# Patient Record
Sex: Male | Born: 1960 | Race: Black or African American | Hispanic: No | Marital: Married | Smoking: Never smoker
Health system: Southern US, Community
[De-identification: ages and names within clinical notes are randomized; demographics above are authoritative.]

## PROBLEM LIST (undated history)

## (undated) DIAGNOSIS — E785 Hyperlipidemia, unspecified: Secondary | ICD-10-CM

## (undated) DIAGNOSIS — I10 Essential (primary) hypertension: Secondary | ICD-10-CM

## (undated) HISTORY — DX: Essential (primary) hypertension: I10

## (undated) HISTORY — DX: Hyperlipidemia, unspecified: E78.5

---

## 2013-03-06 DIAGNOSIS — I10 Essential (primary) hypertension: Secondary | ICD-10-CM | POA: Insufficient documentation

## 2013-03-06 DIAGNOSIS — E785 Hyperlipidemia, unspecified: Secondary | ICD-10-CM | POA: Insufficient documentation

## 2016-06-26 ENCOUNTER — Ambulatory Visit (INDEPENDENT_AMBULATORY_CARE_PROVIDER_SITE_OTHER): Payer: BLUE CROSS/BLUE SHIELD | Admitting: Family Medicine

## 2016-06-26 ENCOUNTER — Encounter: Payer: Self-pay | Admitting: Family Medicine

## 2016-06-26 VITALS — BP 154/104 | HR 80 | Ht 73.6 in | Wt 330.0 lb

## 2016-06-26 DIAGNOSIS — I10 Essential (primary) hypertension: Secondary | ICD-10-CM

## 2016-06-26 DIAGNOSIS — E785 Hyperlipidemia, unspecified: Secondary | ICD-10-CM

## 2016-06-26 DIAGNOSIS — Z23 Encounter for immunization: Secondary | ICD-10-CM | POA: Diagnosis not present

## 2016-06-26 DIAGNOSIS — M25569 Pain in unspecified knee: Secondary | ICD-10-CM | POA: Insufficient documentation

## 2016-06-26 DIAGNOSIS — Z1159 Encounter for screening for other viral diseases: Secondary | ICD-10-CM

## 2016-06-26 DIAGNOSIS — Z114 Encounter for screening for human immunodeficiency virus [HIV]: Secondary | ICD-10-CM | POA: Diagnosis not present

## 2016-06-26 DIAGNOSIS — M25561 Pain in right knee: Secondary | ICD-10-CM

## 2016-06-26 DIAGNOSIS — Z9189 Other specified personal risk factors, not elsewhere classified: Secondary | ICD-10-CM

## 2016-06-26 DIAGNOSIS — R0683 Snoring: Secondary | ICD-10-CM

## 2016-06-26 LAB — COMPREHENSIVE METABOLIC PANEL
ALBUMIN: 4.1 g/dL (ref 3.6–5.1)
ALT: 18 U/L (ref 9–46)
AST: 18 U/L (ref 10–35)
Alkaline Phosphatase: 80 U/L (ref 40–115)
BUN: 14 mg/dL (ref 7–25)
CHLORIDE: 105 mmol/L (ref 98–110)
CO2: 26 mmol/L (ref 20–31)
CREATININE: 0.92 mg/dL (ref 0.70–1.33)
Calcium: 9.5 mg/dL (ref 8.6–10.3)
Glucose, Bld: 99 mg/dL (ref 65–99)
POTASSIUM: 4.3 mmol/L (ref 3.5–5.3)
SODIUM: 141 mmol/L (ref 135–146)
Total Bilirubin: 0.5 mg/dL (ref 0.2–1.2)
Total Protein: 6.3 g/dL (ref 6.1–8.1)

## 2016-06-26 LAB — LIPID PANEL
CHOL/HDL RATIO: 3.2 ratio (ref <=5.0)
CHOLESTEROL: 201 mg/dL — AB (ref 125–200)
HDL: 63 mg/dL (ref >=40)
LDL Cholesterol: 118 mg/dL (ref <130)
Triglycerides: 102 mg/dL (ref <150)
VLDL: 20 mg/dL (ref <30)

## 2016-06-26 LAB — CBC
HCT: 49.8 % (ref 38.5–50.0)
Hemoglobin: 16.6 g/dL (ref 13.2–17.1)
MCH: 30.3 pg (ref 27.0–33.0)
MCHC: 33.3 g/dL (ref 32.0–36.0)
MCV: 90.9 fL (ref 80.0–100.0)
MPV: 10.2 fL (ref 7.5–12.5)
PLATELETS: 187 10*3/uL (ref 140–400)
RBC: 5.48 MIL/uL (ref 4.20–5.80)
RDW: 14.4 % (ref 11.0–15.0)
WBC: 4.7 10*3/uL (ref 3.8–10.8)

## 2016-06-26 LAB — HEMOGLOBIN A1C
Hgb A1c MFr Bld: 5.4 % (ref <5.7)
MEAN PLASMA GLUCOSE: 108 mg/dL

## 2016-06-26 LAB — TSH: TSH: 0.52 mIU/L (ref 0.40–4.50)

## 2016-06-26 LAB — HEPATITIS C ANTIBODY: HCV Ab: NEGATIVE

## 2016-06-26 MED ORDER — OLMESARTAN MEDOXOMIL-HCTZ 40-12.5 MG PO TABS: 1.0000 | ORAL_TABLET | Freq: Every day | ORAL | 1 refills | Status: DC

## 2016-06-26 MED ORDER — ATORVASTATIN CALCIUM 40 MG PO TABS: 40.0000 mg | ORAL_TABLET | Freq: Every day | ORAL | 0 refills | Status: DC

## 2016-06-26 NOTE — Progress Notes (Signed)
Albert Mills is a 55 y.o. male who presents to [name]: [facility] today to establish care.  He has a history of hypertension and hyperlipidemia.  His only current complaint at this time is a shooting pain down his right leg from his anterior thigh to medial knee.  This pain has been going on for 2 years and he reports it is well controlled with visits to the chiropractor and tumeric.  The pain occurs only at night and is associated with numbness.  Denies alleviating and aggravating factors.  Denies fevers, chills, weight loss, and weakness.    Hypertension: claims his pressure usually runs in the 140s systolic.  Was on Benicar for years until taking himself off of it 3 years ago.  Reports a history of ACE inhibitor induced cough.  Denies chest pain and palpitations.  Hyperlipidemia: Stopped taking Lipitor 3 or 4 years ago.  Takes Omega 3 fish oil every morning.  Denies problems taking Lipitor.    Health maintenance:  Due for colon cancer screening, hepatitis C screening, and a tetanus shot.  Refused HIV screening today.  OSA screen resulted as high risk.    Past Medical History:  Diagnosis Date  . HTN (hypertension)   . Hyperlipemia    History reviewed. No pertinent surgical history. Social History  Substance Use Topics  . Smoking status: Never Smoker  . Smokeless tobacco: Never Used  . Alcohol use Yes   family history includes Heart disease in his paternal grandfather and paternal uncle; Lupus in his mother and sister.  ROS as above: No headache, visual changes, nausea, vomiting, diarrhea, constipation, dizziness, abdominal pain, skin rash, fevers, chills, night sweats, weight loss, swollen lymph nodes, body aches, joint swelling, chest pain, shortness of breath, mood changes, visual or auditory hallucinations.   Medications: Current Outpatient Prescriptions  Medication Sig  Dispense Refill  . atorvastatin (LIPITOR) 40 MG tablet Take 1 tablet (40 mg total) by mouth daily. 90 tablet 0  . olmesartan-hydrochlorothiazide (BENICAR HCT) 40-12.5 MG tablet Take 1 tablet by mouth daily. 30 tablet 1   No current facility-administered medications for this visit.    Allergies  Allergen Reactions  . Lisinopril Other (See Comments)    Cough     Exam:  BP (!) 154/104   Pulse 80   Ht 6' 1.6" (1.869 m)   Wt (!) 330 lb (149.7 kg)   BMI 42.83 kg/m  Gen: Well NAD HEENT: EOMI,  MMM Lungs: Normal work of breathing. CTABL, large neck Heart: RRR no MRG Abd: NABS, Soft. Nondistended, Nontender.  Obese Exts: Brisk capillary refill, warm and well perfused.  MSK: Negative straight leg raise bilaterally.  5/5 strength in bilateral lower extremities.  Sensation intact to light touch.  Normal right hip range of motion without pain.  No results found for this or any previous visit (from the past 24 hour(s)). No results found.    Assessment and Plan: 55 y.o. male with right leg pain and numbness concerning for lumbar radiculopathy.  Currently well controlled with chiropractor visits and tumeric.    Hypertension:  154/104 in the clinic.  Will restart his Benicar HCT 40-12.5 mg daily given his history of ACE induced cough.    Hyperlipidemia: Recheck lipid panel.  Start Lipitor 40 mg given CAD risk factors of obesity, HLD, age, and family history.    Health maintenance:  Patient will decide which type of colon cancer screening he prefers.  Will follow  up with him regarding this at his next visit in a month.  Given tetanus shot.  Deferred HIV screening but will check Hepatitis C.  Outpatient sleep study ordered after he had a high risk OSA screen.    We also will check CBC, CMP, Vitamin D, A1c, and TSH.  Review results at follow up in 1 month.    Discussed warning signs or symptoms. Please see discharge instructions. Patient expresses understanding.

## 2016-06-26 NOTE — Addendum Note (Signed)
Addended by: Rodolph Bong on: 06/26/2016 10:56 AM   Modules accepted: Orders

## 2016-06-26 NOTE — Patient Instructions (Addendum)
Thank you for coming in today. Return in 1 month.  Get fasting labs.  You should hear from [place] sleep medicine soon.  Return sooner if needed.   Start Benicar and Lipitor.   Hydrochlorothiazide, HCTZ; Olmesartan Tablets What is this medicine? HYDROCHLOROTHIAZIDE; OLMESARTAN (hye droe klor oh THYE a zide; all mi SAR tan) is a combination of a diuretic and a drug that relaxes blood vessels. It is used to treat high blood pressure. This medicine may be used for other purposes; ask your health care provider or pharmacist if you have questions. What should I tell my health care provider before I take this medicine? They need to know if you have any of these conditions: -asthma -decreased urine -if you are on a special diet, such as a low salt diet -immune system problems, like lupus -kidney disease -liver disease -an unusual reaction to hydrochlorothiazide, olmesartan, sulfa drugs, other medicines, foods, dyes, or preservatives -pregnant or trying to get pregnant -breast-feeding How should I use this medicine? Take this medicine by mouth with a glass of water. Follow the directions on the prescription label. You can take it with or without food. If it upsets your stomach, take it with food. Take your medicine at regular intervals. Do not take it more often than directed. Do not stop taking except on your doctor's advice. Talk to your pediatrician regarding the use of this medicine in children. Special care may be needed. Overdosage: If you think you have taken too much of this medicine contact a poison control center or emergency room at once. NOTE: This medicine is only for you. Do not share this medicine with others. What if I miss a dose? If you miss a dose, take it as soon as you can. If it is almost time for your next dose, take only that dose. Do not take double or extra doses. What may interact with this medicine? -barbiturates like phenobarbital -corticosteroids like  prednisone -diuretics, especially triamterene, spironolactone or amiloride -lithium -medicines for diabetes -medicines for high blood pressure -NSAIDs like ibuprofen -potassium salts or potassium supplements -prescription pain medicines -skeletal muscle relaxants like tubocurarine -some cholesterol lowering medications like cholestyramine or colestipol This list may not describe all possible interactions. Give your health care provider a list of all the medicines, herbs, non-prescription drugs, or dietary supplements you use. Also tell them if you smoke, drink alcohol, or use illegal drugs. Some items may interact with your medicine. What should I watch for while using this medicine? Check your blood pressure regularly while you are taking this medicine. Ask your doctor or health care professional what your blood pressure should be and when you should contact him or her. When you check your blood pressure, write down the measurements to show your doctor or health care professional. If you are taking this medicine for a long time, you must visit your health care professional for regular checks on your progress. Make sure you schedule appointments on a regular basis. You must not get dehydrated. Ask your doctor or health care professional how much fluid you need to drink a day. Check with him or her if you get an attack of severe diarrhea, nausea and vomiting, or if you sweat a lot. The loss of too much body fluid can make it dangerous for you to take this medicine. Women should inform their doctor if they wish to become pregnant or think they might be pregnant. There is a potential for serious side effects to an unborn child, particularly  in the second or third trimester. Talk to your health care professional or pharmacist for more information. You may get drowsy or dizzy. Do not drive, use machinery, or do anything that needs mental alertness until you know how this drug affects you. Do not stand or  sit up quickly, especially if you are an older patient. This reduces the risk of dizzy or fainting spells. Alcohol can make you more drowsy and dizzy. Avoid alcoholic drinks. This medicine may affect your blood sugar level. If you have diabetes, check with your doctor or health care professional before changing the dose of your diabetic medicine. Avoid salt substitutes unless you are told otherwise by your doctor or health care professional. Do not treat yourself for coughs, colds, or pain while you are taking this medicine without asking your doctor or health care professional for advice. Some ingredients may increase your blood pressure. This medicine can make you more sensitive to the sun. Keep out of the sun. If you cannot avoid being in the sun, wear protective clothing and use sunscreen. Do not use sun lamps or tanning beds/booths. What side effects may I notice from receiving this medicine? Side effects that you should report to your doctor or health care professional as soon as possible: -allergic reactions like skin rash, itching or hives, swelling of the face, lips, or tongue -breathing problems -changes in vision -dark urine -diarrhea -eye pain -fast or irregular heart beat, palpitations, or chest pain -feeling faint or lightheaded -muscle cramps -persistent dry cough -redness, blistering, peeling or loosening of the skin, including inside the mouth -stomach pain -trouble passing urine or change in the amount of urine -unusual bleeding or bruising -vomiting -weight loss -worsened gout pain -yellowing of the eyes or skin Side effects that usually do not require medical attention (report to your doctor or health care professional if they continue or are bothersome): -change in sex drive or performance -headache -nausea This list may not describe all possible side effects. Call your doctor for medical advice about side effects. You may report side effects to FDA at  1-800-FDA-1088. Where should I keep my medicine?  Atorvastatin tablets What is this medicine? ATORVASTATIN (a TORE va sta tin) is known as a HMG-CoA reductase inhibitor or 'statin'. It lowers the level of cholesterol and triglycerides in the blood. This drug may also reduce the risk of heart attack, stroke, or other health problems in patients with risk factors for heart disease. Diet and lifestyle changes are often used with this drug. This medicine may be used for other purposes; ask your health care provider or pharmacist if you have questions. What should I tell my health care provider before I take this medicine? They need to know if you have any of these conditions: -frequently drink alcoholic beverages -history of stroke, TIA -kidney disease -liver disease -muscle aches or weakness -other medical condition -an unusual or allergic reaction to atorvastatin, other medicines, foods, dyes, or preservatives -pregnant or trying to get pregnant -breast-feeding How should I use this medicine? Take this medicine by mouth with a glass of water. Follow the directions on the prescription label. You can take this medicine with or without food. Take your doses at regular intervals. Do not take your medicine more often than directed. Talk to your pediatrician regarding the use of this medicine in children. While this drug may be prescribed for children as young as 3 years old for selected conditions, precautions do apply. Overdosage: If you think you have taken too  much of this medicine contact a poison control center or emergency room at once. NOTE: This medicine is only for you. Do not share this medicine with others. What if I miss a dose? If you miss a dose, take it as soon as you can. If it is almost time for your next dose, take only that dose. Do not take double or extra doses. What may interact with this medicine? Do not take this medicine with any of the following medications: -red yeast  rice -telaprevir -telithromycin -voriconazole This medicine may also interact with the following medications: -alcohol -antiviral medicines for HIV or AIDS -boceprevir -certain antibiotics like clarithromycin, erythromycin, troleandomycin -certain medicines for cholesterol like fenofibrate or gemfibrozil -cimetidine -clarithromycin -colchicine -cyclosporine -digoxin -male hormones, like estrogens or progestins and birth control pills -grapefruit juice -medicines for fungal infections like fluconazole, itraconazole, ketoconazole -niacin -rifampin -spironolactone This list may not describe all possible interactions. Give your health care provider a list of all the medicines, herbs, non-prescription drugs, or dietary supplements you use. Also tell them if you smoke, drink alcohol, or use illegal drugs. Some items may interact with your medicine. What should I watch for while using this medicine? Visit your doctor or health care professional for regular check-ups. You may need regular tests to make sure your liver is working properly. Tell your doctor or health care professional right away if you get any unexplained muscle pain, tenderness, or weakness, especially if you also have a fever and tiredness. Your doctor or health care professional may tell you to stop taking this medicine if you develop muscle problems. If your muscle problems do not go away after stopping this medicine, contact your health care professional. This drug is only part of a total heart-health program. Your doctor or a dietician can suggest a low-cholesterol and low-fat diet to help. Avoid alcohol and smoking, and keep a proper exercise schedule. Do not use this drug if you are pregnant or breast-feeding. Serious side effects to an unborn child or to an infant are possible. Talk to your doctor or pharmacist for more information. This medicine may affect blood sugar levels. If you have diabetes, check with your doctor  or health care professional before you change your diet or the dose of your diabetic medicine. If you are going to have surgery tell your health care professional that you are taking this drug. What side effects may I notice from receiving this medicine? Side effects that you should report to your doctor or health care professional as soon as possible: -allergic reactions like skin rash, itching or hives, swelling of the face, lips, or tongue -dark urine -fever -joint pain -muscle cramps, pain -redness, blistering, peeling or loosening of the skin, including inside the mouth -trouble passing urine or change in the amount of urine -unusually weak or tired -yellowing of eyes or skin Side effects that usually do not require medical attention (report to your doctor or health care professional if they continue or are bothersome): -constipation -heartburn -stomach gas, pain, upset This list may not describe all possible side effects. Call your doctor for medical advice about side effects. You may report side effects to FDA at 1-800-FDA-1088. Where should I keep my medicine? Keep out of the reach of children. Store at room temperature between 20 to 25 degrees C (68 to 77 degrees F). Throw away any unused medicine after the expiration date. NOTE: This sheet is a summary. It may not cover all possible information. If you have questions about  this medicine, talk to your doctor, pharmacist, or health care provider.    2016, Elsevier/Gold Standard. (2011-10-10 16:10:96)  Keep out of the reach of children. Store your medicine at room temperature between 20 and 25 degrees C (68 and 77 degrees F). Throw away any unused medicine after the expiration date. NOTE: This sheet is a summary. It may not cover all possible information. If you have questions about this medicine, talk to your doctor, pharmacist, or health care provider.    2016, Elsevier/[employer] Standard. (2012-06-05 13:53:44)

## 2016-06-27 DIAGNOSIS — Z9189 Other specified personal risk factors, not elsewhere classified: Secondary | ICD-10-CM | POA: Insufficient documentation

## 2016-06-27 LAB — VITAMIN D 25 HYDROXY (VIT D DEFICIENCY, FRACTURES): Vit D, 25-Hydroxy: 27 ng/mL — ABNORMAL LOW (ref 30–100)

## 2016-07-27 ENCOUNTER — Ambulatory Visit: Payer: BLUE CROSS/BLUE SHIELD | Admitting: Family Medicine

## 2016-08-01 ENCOUNTER — Ambulatory Visit (INDEPENDENT_AMBULATORY_CARE_PROVIDER_SITE_OTHER): Payer: BLUE CROSS/BLUE SHIELD | Admitting: Family Medicine

## 2016-08-01 ENCOUNTER — Encounter: Payer: Self-pay | Admitting: Family Medicine

## 2016-08-01 VITALS — BP 153/92 | HR 87 | Temp 97.8°F | Resp 18 | Wt 332.0 lb

## 2016-08-01 DIAGNOSIS — K219 Gastro-esophageal reflux disease without esophagitis: Secondary | ICD-10-CM | POA: Insufficient documentation

## 2016-08-01 DIAGNOSIS — R0683 Snoring: Secondary | ICD-10-CM | POA: Diagnosis not present

## 2016-08-01 DIAGNOSIS — E785 Hyperlipidemia, unspecified: Secondary | ICD-10-CM

## 2016-08-01 DIAGNOSIS — I10 Essential (primary) hypertension: Secondary | ICD-10-CM

## 2016-08-01 MED ORDER — OLMESARTAN MEDOXOMIL-HCTZ 40-25 MG PO TABS: 1.0000 | ORAL_TABLET | Freq: Every day | ORAL | 1 refills | Status: DC

## 2016-08-01 MED ORDER — OMEPRAZOLE 40 MG PO CPDR: 40.0000 mg | DELAYED_RELEASE_CAPSULE | Freq: Every day | ORAL | 3 refills | Status: DC

## 2016-08-01 MED ORDER — AMLODIPINE BESYLATE 10 MG PO TABS: 10.0000 mg | ORAL_TABLET | Freq: Every day | ORAL | 1 refills | Status: DC

## 2016-08-01 NOTE — Patient Instructions (Addendum)
Thank you for coming in today. Return in 1 month.  You should hear from sleep study as well as the cologuard people.  Return sooner if needed.   Increase Benicar to 40/25.  Add amlodipine 10mg  daily.  Take omeprazole daily for heart burn.   Amlodipine tablets What is this medicine? AMLODIPINE (am LOE di peen) is a calcium-channel blocker. It affects the amount of calcium found in your heart and muscle cells. This relaxes your blood vessels, which can reduce the amount of work the heart has to do. This medicine is used to lower high blood pressure. It is also used to prevent chest pain. This medicine may be used for other purposes; ask your health care provider or pharmacist if you have questions. What should I tell my health care provider before I take this medicine? They need to know if you have any of these conditions: -heart problems like heart failure or aortic stenosis -liver disease -an unusual or allergic reaction to amlodipine, other medicines, foods, dyes, or preservatives -pregnant or trying to get pregnant -breast-feeding How should I use this medicine? Take this medicine by mouth with a glass of water. Follow the directions on the prescription label. Take your medicine at regular intervals. Do not take more medicine than directed. Talk to your pediatrician regarding the use of this medicine in children. Special care may be needed. This medicine has been used in children as young as 6. Persons over 55 years old may have a stronger reaction to this medicine and need smaller doses. Overdosage: If you think you have taken too much of this medicine contact a poison control center or emergency room at once. NOTE: This medicine is only for you. Do not share this medicine with others. What if I miss a dose? If you miss a dose, take it as soon as you can. If it is almost time for your next dose, take only that dose. Do not take double or extra doses. What may interact with this  medicine? -herbal or dietary supplements -local or general anesthetics -medicines for high blood pressure -medicines for prostate problems -rifampin This list may not describe all possible interactions. Give your health care provider a list of all the medicines, herbs, non-prescription drugs, or dietary supplements you use. Also tell them if you smoke, drink alcohol, or use illegal drugs. Some items may interact with your medicine. What should I watch for while using this medicine? Visit your doctor or health care professional for regular check ups. Check your blood pressure and pulse rate regularly. Ask your health care professional what your blood pressure and pulse rate should be, and when you should contact him or her. This medicine may make you feel confused, dizzy or lightheaded. Do not drive, use machinery, or do anything that needs mental alertness until you know how this medicine affects you. To reduce the risk of dizzy or fainting spells, do not sit or stand up quickly, especially if you are an older patient. Avoid alcoholic drinks; they can make you more dizzy. Do not suddenly stop taking amlodipine. Ask your doctor or health care professional how you can gradually reduce the dose. What side effects may I notice from receiving this medicine? Side effects that you should report to your doctor or health care professional as soon as possible: -allergic reactions like skin rash, itching or hives, swelling of the face, lips, or tongue -breathing problems -changes in vision or hearing -chest pain -fast, irregular heartbeat -swelling of legs or ankles  Side effects that usually do not require medical attention (report to your doctor or health care professional if they continue or are bothersome): -dry mouth -facial flushing -nausea, vomiting -stomach gas, pain -tired, weak -trouble sleeping This list may not describe all possible side effects. Call your doctor for medical advice about  side effects. You may report side effects to FDA at 1-800-FDA-1088. Where should I keep my medicine? Keep out of the reach of children. Store at room temperature between 59 and 86 degrees F (15 and 30 degrees C). Protect from light. Keep container tightly closed. Throw away any unused medicine after the expiration date. NOTE: This sheet is a summary. It may not cover all possible information. If you have questions about this medicine, talk to your doctor, pharmacist, or health care provider.    2016, Elsevier/Gold Standard. (2012-10-18 11:40:58)

## 2016-08-01 NOTE — Progress Notes (Signed)
       Albert Mills is a 55 y.o. male who [facility] today for follow-up hypertension and hyperlipidemia as well as sleep apnea.  Hypertension: Patient currently takes a losartan/hydrochlorothiazide 40/12.5 mg daily. He tolerates his medication well with no chest pains palpitations lightheadedness or shortness of breath.  Hyperlipidemia: Patient is currently taking atorvastatin 40 mg daily. He notes he is doing reasonably well with this medication but does complain of acid reflux with this medication. He denies any significant muscle pains or aches.  Sleep apnea: A sleep study was ordered at the last visit due to concern of possible sleep apnea and Mr. Albert Mills who suffers from obesity as well as snoring and hypertension. He was never contacted by the sleep lab.   Past Medical History:  Diagnosis Date  . HTN (hypertension)   . Hyperlipemia    History reviewed. No pertinent surgical history. Social History  Substance Use Topics  . Smoking status: Never Smoker  . Smokeless tobacco: Never Used  . Alcohol use Yes   family history includes Heart disease in his paternal grandfather and paternal uncle; Lupus in his mother and sister.  ROS as above:  Medications: Current Outpatient Prescriptions  Medication Sig Dispense Refill  . amLODipine (NORVASC) 10 MG tablet Take 1 tablet (10 mg total) by mouth daily. 30 tablet 1  . atorvastatin (LIPITOR) 40 MG tablet Take 1 tablet (40 mg total) by mouth daily. 90 tablet 0  . olmesartan-hydrochlorothiazide (BENICAR HCT) 40-25 MG tablet Take 1 tablet by mouth daily. 30 tablet 1  . omeprazole (PRILOSEC) 40 MG capsule Take 1 capsule (40 mg total) by mouth daily. 90 capsule 3   No current facility-administered medications for this visit.    Allergies  Allergen Reactions  . Lisinopril Other (See Comments)    Cough     Exam:   BP (!) 153/92 (BP Location: Right Arm, Patient Position: Sitting, Cuff Size: Large)   Pulse 87   Temp 97.8 F (36.6 C) (Oral)   Resp 18   Wt (!) 332 lb (150.6 kg)   SpO2 97%   BMI 43.09 kg/m  Gen: Well NAD Obese HEENT: EOMI,  MMM Lungs: Normal work of breathing. CTABL Heart: RRR no MRG Abd: NABS, Soft. Nondistended, Nontender Exts: Brisk capillary refill, warm and well perfused.   No results found for this or any previous visit (from the past 24 hour(s)). No results found.    Assessment and Plan: 55 y.o. male with  1) hypertension: Not quite at goal. Increase losartan/hydrochlorothiazide to 40/25 mg daily. Add amlodipine. Recheck in one month  2) hyperlipidemia: Continue atorvastatin. We'll check fasting labs in a month or 2.  3) acid reflux: Start omeprazole daily.  4) colon cancer screening. Patient requests [employer]  5) possible sleep apnea: We'll reorder home sleep study.   No orders of the defined types were placed in this encounter.   Discussed warning signs or symptoms. Please see discharge instructions. Patient expresses understanding.

## 2016-08-01 NOTE — Progress Notes (Signed)
Pt here for follow up on labs and medication.

## 2016-09-27 ENCOUNTER — Other Ambulatory Visit: Payer: Self-pay | Admitting: Family Medicine

## 2016-10-23 ENCOUNTER — Encounter (HOSPITAL_BASED_OUTPATIENT_CLINIC_OR_DEPARTMENT_OTHER): Payer: BLUE CROSS/BLUE SHIELD

## 2016-11-02 ENCOUNTER — Other Ambulatory Visit: Payer: Self-pay | Admitting: Family Medicine

## 2016-11-02 DIAGNOSIS — E785 Hyperlipidemia, unspecified: Secondary | ICD-10-CM

## 2016-11-14 ENCOUNTER — Ambulatory Visit (HOSPITAL_BASED_OUTPATIENT_CLINIC_OR_DEPARTMENT_OTHER): Payer: BLUE CROSS/BLUE SHIELD | Attending: Family Medicine | Admitting: Internal Medicine

## 2016-11-14 DIAGNOSIS — G4733 Obstructive sleep apnea (adult) (pediatric): Secondary | ICD-10-CM | POA: Insufficient documentation

## 2016-11-14 DIAGNOSIS — Z6841 Body Mass Index (BMI) 40.0 and over, adult: Secondary | ICD-10-CM | POA: Insufficient documentation

## 2016-11-14 DIAGNOSIS — I10 Essential (primary) hypertension: Secondary | ICD-10-CM

## 2016-11-14 DIAGNOSIS — R0683 Snoring: Secondary | ICD-10-CM

## 2016-12-05 DIAGNOSIS — Z1211 Encounter for screening for malignant neoplasm of colon: Secondary | ICD-10-CM | POA: Diagnosis not present

## 2016-12-05 LAB — HM COLONOSCOPY

## 2016-12-23 NOTE — Procedures (Signed)
   Patient Name: Albert Mills, Albert Mills Study Date: 11/14/2016 Gender: Male D.O.B: 20-Jul-1961 Age (years): 55 Referring Provider: Rodolph BongEvan S Corey Height (inches): 72 Interpreting Physician: Jetty Duhamellinton Harlym Gehling MD, ABSM Weight (lbs): 320 RPSGT: [place], [place] BMI: 43 MRN: [id number] Neck Size: 18.50 CLINICAL INFORMATION Sleep Study Type: unattended HST     Indication for sleep study: Obesity, Snoring     Epworth Sleepiness Score: 13  SLEEP STUDY TECHNIQUE A multi-channel overnight portable sleep study was performed. The channels recorded were: nasal airflow, thoracic respiratory movement, and oxygen saturation with a pulse oximetry. Snoring was also monitored.  MEDICATIONS Patient self administered medications include: none reported.  SLEEP ARCHITECTURE Patient was studied for 342.0 minutes. The sleep efficiency was 93.9 % and the patient was supine for 87.3%. The arousal index was 0.0 per hour.  RESPIRATORY PARAMETERS The overall AHI was 21.1 per hour, with a central apnea index of 0.0 per hour.  The oxygen nadir was 82% during sleep.  CARDIAC DATA Mean heart rate during sleep was 84.0 bpm.  IMPRESSIONS - Moderate obstructive sleep apnea occurred during this study (AHI = 21.1/h). - No significant central sleep apnea occurred during this study (CAI = 0.0/h). - Moderate oxygen desaturation was noted during this study (Min O2 = 82%, Mean saturation 92%). - Patient snored .  DIAGNOSIS - Obstructive Sleep Apnea (327.23 [G47.33 ICD-10])  RECOMMENDATIONS - Recommend CPAP titration. Other options would be based on clinical judgment. - Positional therapy avoiding supine position during sleep. - Avoid alcohol, sedatives and other CNS depressants that may worsen sleep apnea and disrupt normal sleep architecture. - Sleep hygiene should be reviewed to assess factors that may improve sleep quality. - Weight management and regular exercise should be initiated or  continued.  [Electronically signed] 12/23/2016 11:21 AM  Jetty Duhamellinton Danta Baumgardner MD, ABSM Diplomate, American Board of Sleep Medicine   NPI: [id number]  Waymon BudgeYOUNG,Jaymond Waage D Diplomate, American Board of Sleep Medicine  ELECTRONICALLY SIGNED ON:  12/23/2016, 11:17 AM [facility] PH: [phone]   FX: [phone] ACCREDITED BY THE AMERICAN ACADEMY OF SLEEP MEDICINE

## 2016-12-25 ENCOUNTER — Telehealth: Payer: Self-pay | Admitting: Family Medicine

## 2016-12-25 DIAGNOSIS — G4733 Obstructive sleep apnea (adult) (pediatric): Secondary | ICD-10-CM | POA: Insufficient documentation

## 2016-12-25 MED ORDER — AMBULATORY NON FORMULARY MEDICATION: 0 refills | Status: DC

## 2016-12-25 NOTE — Telephone Encounter (Signed)
Sleep study shows sleep apnea. We will prescribe a CPAP machine. Let me know if you do not hear from the medical supply company.

## 2016-12-26 NOTE — Telephone Encounter (Signed)
CPAP order, sleep study, demographics, insurance, and OV notes faxed to [facility] at [phone].

## 2016-12-29 ENCOUNTER — Telehealth: Payer: Self-pay | Admitting: Family Medicine

## 2017-01-02 NOTE — Telephone Encounter (Signed)
Patient advised of the sleep apnea. He will call [facility] to get set up. He is having sinus headaches for the last 2-3 weeks. He has tried mucinex and BC powders. What would you recommend he try?

## 2017-01-02 NOTE — Telephone Encounter (Signed)
Left message on voicemail for a return call. 

## 2017-01-03 NOTE — Telephone Encounter (Signed)
Sounds good

## 2017-01-03 NOTE — Telephone Encounter (Signed)
Unsure of what you are referring to.

## 2017-01-04 NOTE — Telephone Encounter (Signed)
Left detailed vm with recommendations. Requested a call back with concerns.  

## 2017-01-04 NOTE — Telephone Encounter (Signed)
OTC medicines are fine. If not improving pt will need to RTC for exam

## 2017-01-12 DIAGNOSIS — G4733 Obstructive sleep apnea (adult) (pediatric): Secondary | ICD-10-CM | POA: Diagnosis not present

## 2017-01-17 NOTE — Telephone Encounter (Signed)
Note opened in error.

## 2017-01-28 ENCOUNTER — Other Ambulatory Visit: Payer: Self-pay | Admitting: Family Medicine

## 2017-01-28 DIAGNOSIS — E785 Hyperlipidemia, unspecified: Secondary | ICD-10-CM

## 2017-01-29 ENCOUNTER — Encounter: Payer: Self-pay | Admitting: Family Medicine

## 2017-01-29 ENCOUNTER — Ambulatory Visit (INDEPENDENT_AMBULATORY_CARE_PROVIDER_SITE_OTHER): Payer: BLUE CROSS/BLUE SHIELD | Admitting: Family Medicine

## 2017-01-29 VITALS — BP 160/88 | HR 90 | Temp 98.0°F | Wt 334.0 lb

## 2017-01-29 DIAGNOSIS — E782 Mixed hyperlipidemia: Secondary | ICD-10-CM | POA: Diagnosis not present

## 2017-01-29 DIAGNOSIS — J0101 Acute recurrent maxillary sinusitis: Secondary | ICD-10-CM

## 2017-01-29 DIAGNOSIS — I10 Essential (primary) hypertension: Secondary | ICD-10-CM | POA: Diagnosis not present

## 2017-01-29 MED ORDER — CEFDINIR 300 MG PO CAPS: 300.0000 mg | ORAL_CAPSULE | Freq: Two times a day (BID) | ORAL | 0 refills | Status: DC

## 2017-01-29 MED ORDER — SILDENAFIL CITRATE 20 MG PO TABS: 20.0000 mg | ORAL_TABLET | ORAL | 11 refills | Status: DC | PRN

## 2017-01-29 MED ORDER — ATORVASTATIN CALCIUM 40 MG PO TABS: 40.0000 mg | ORAL_TABLET | Freq: Every day | ORAL | 3 refills | Status: DC

## 2017-01-29 MED ORDER — AMLODIPINE BESYLATE 10 MG PO TABS: 10.0000 mg | ORAL_TABLET | Freq: Every day | ORAL | 3 refills | Status: DC

## 2017-01-29 MED ORDER — FLUTICASONE PROPIONATE 50 MCG/ACT NA SUSP: 2.0000 | Freq: Every day | NASAL | 2 refills | Status: DC

## 2017-01-29 NOTE — Progress Notes (Signed)
Albert Mills is a 56 y.o. male who [facility] today for sinus pain and pressure. Symptoms present for a few days now. Patient has bilateral pain and pressure in the sinuses associated with clear nasal discharge and congestion. He notes also he's been using his CPAP machine recently and finds the mass to be very uncomfortable with the recent symptoms. 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  Additionally he notes he had recent health screening at an outside medical group with a blood pressure measured 127/82 as well as cholesterol and chemistry labs that were obtained.  Hypertension: Patient has run out of amlodipine but takes the medications listed below. He notes he needs refills of both the amlodipine and the atorvastatin.   Past Medical History:  Diagnosis Date  . HTN (hypertension)   . Hyperlipemia    No past surgical history on file. Social History  Substance Use Topics  . Smoking status: Never Smoker  . Smokeless tobacco: Never Used  . Alcohol use Yes   family history includes Heart disease in his paternal grandfather and paternal uncle; Lupus in his mother and sister.  ROS as above:  Medications: Current Outpatient Prescriptions  Medication Sig Dispense Refill  . AMBULATORY NON FORMULARY MEDICATION Continuous positive airway pressure (CPAP) auto titrate to a maximum pressure of 20 cm of H2O pressure, with all supplemental supplies as needed. 1 each 0  . amLODipine (NORVASC) 10 MG tablet Take 1 tablet (10 mg total) by mouth daily. 90 tablet 3  . atorvastatin (LIPITOR) 40 MG tablet Take 1 tablet (40 mg total) by mouth daily at 6 PM. 90 tablet 3  . olmesartan-hydrochlorothiazide (BENICAR HCT) 40-25 MG tablet Take 1 tablet by mouth  daily. 30 tablet 1  . omeprazole (PRILOSEC) 40 MG capsule Take 1 capsule (40 mg total) by mouth daily. 90 capsule 3  . cefdinir (OMNICEF) 300 MG capsule Take 1 capsule (300 mg total) by mouth 2 (two) times daily. 14 capsule 0  . fluticasone (FLONASE) 50 MCG/ACT nasal spray Place 2 sprays into both nostrils daily. 16 g 2  . sildenafil (REVATIO) 20 MG tablet Take 1 tablet (20 mg total) by mouth as needed. 50 tablet 11   No current facility-administered medications for this visit.    Allergies  Allergen Reactions  . Lisinopril Other (See Comments)    Cough    Health Maintenance Health Maintenance  Topic Date Due  . COLONOSCOPY  04/09/2011  . INFLUENZA VACCINE  08/04/2017 (Originally 07/04/2016)  . HIV Screening  06/26/2029 (Originally 04/08/1976)  . TETANUS/TDAP  06/26/2026  . Hepatitis C Screening  Completed     Exam:  BP (!) 160/88   Pulse 90   Temp 98 F (36.7 C) (Oral)   Wt (!) 334 lb (151.5 kg)   SpO2 98%   BMI 44.07 kg/m  Gen: Well NAD obese HEENT: EOMI,  MMM clear nasal discharge. Inflamed nasal turbinates bilaterally. Tender to palpation bilateral maxillary sinuses. Normal tympanic membranes and posterior pharynx. Lungs: Normal work of breathing. CTABL Heart: RRR no MRG Abd: NABS, Soft. Nondistended, Nontender Exts: Brisk capillary refill, warm and well perfused.   MRI Brain Head Wo Contrast8/20/2013 [facility] Result Narrative  CLINICAL INDICATION:DIZZINESS COMMENTS:   FMR [id number] - MRI HEAD/BRAIN- Jul 23 2012     TECHNIQUE:Multiplanar, multisequence MRI  of the brain without contrast.  COMPARISON: None.  FINDINGS:  No evidence of restricted diffusion or acute ischemia. No acute hemorrhage, mass, hydrocephalus, or extra-axial collection. Normal intracranial flow voids. Nodular opacity right sphenoid sinus likely a mucus retention cyst. Mastoid air cells are clear.   IMPRESSION: No acute abnormality.   READING DATE: (07/23/2012 05:43PM) SPECIAL  CASE: ( ) TEACHING CASE: ( ) TRANSCRIBED BY: (PSCB) RELEASE RESULTS: (Y) SPECIAL INTEREST CODE(S): (\) ___________________________________________________________ Current Report Read [id number] on Aug 20 20135:43P Transcribed byJenetta Downer on Aug 20 20135:49P Electronically Signed by:DR. BRADLEY VANDYKE on:Aug 20 20135:49P      No results found for this or any previous visit (from the past 72 hour(s)). No results found.    Assessment and Plan: 56 y.o. male with  Recurrent sinusitis. Treat empirically with Omnicef and Flonase nasal spray. If symptoms persist or continue will likely refer to ENT.  Hypertension: Blood pressure is typically well controlled. Patient has run out of amlodipine. Plan to refill medications and obtain records from recent health screening.  Hyperlipidemia: Refill atorvastatin and obtain medical records.   No orders of the defined types were placed in this encounter.  Meds ordered this encounter  Medications  . cefdinir (OMNICEF) 300 MG capsule    Sig: Take 1 capsule (300 mg total) by mouth 2 (two) times daily.    Dispense:  14 capsule    Refill:  0  . fluticasone (FLONASE) 50 MCG/ACT nasal spray    Sig: Place 2 sprays into both nostrils daily.    Dispense:  16 g    Refill:  2  . amLODipine (NORVASC) 10 MG tablet    Sig: Take 1 tablet (10 mg total) by mouth daily.    Dispense:  90 tablet    Refill:  3  . atorvastatin (LIPITOR) 40 MG tablet    Sig: Take 1 tablet (40 mg total) by mouth daily at 6 PM.    Dispense:  90 tablet    Refill:  3  . sildenafil (REVATIO) 20 MG tablet    Sig: Take 1 tablet (20 mg total) by mouth as needed.    Dispense:  50 tablet    Refill:  11     Discussed warning signs or symptoms. Please see discharge instructions. Patient expresses understanding.

## 2017-01-29 NOTE — Patient Instructions (Addendum)
Thank you for coming in today. Try to get those results to me.  Take the omnicef and the nasal spray.  [email or web address] Fax [phone]   If the blood pressure at the screening the other day was normal we are good for 6 months.   Resume CPAP when feeling better   If unable to tolerate CPAP please let me know.   We may consider ENT referral.    Sinusitis, Adult Sinusitis is soreness and inflammation of your sinuses. Sinuses are hollow spaces in the bones around your face. Your sinuses are located:  Around your eyes.  In the middle of your forehead.  Behind your nose.  In your cheekbones. Your sinuses and nasal passages are lined with a stringy fluid (mucus). Mucus normally drains out of your sinuses. When your nasal tissues become inflamed or swollen, the mucus can become trapped or blocked so air cannot flow through your sinuses. This allows bacteria, viruses, and funguses to grow, which leads to infection. Sinusitis can develop quickly and last for 7?10 days (acute) or for more than 12 weeks (chronic). Sinusitis often develops after a cold. What are the causes? This condition is caused by anything that creates swelling in the sinuses or stops mucus from draining, including:  Allergies.  Asthma.  Bacterial or viral infection.  Abnormally shaped bones between the nasal passages.  Nasal growths that contain mucus (nasal polyps).  Narrow sinus openings.  Pollutants, such as chemicals or irritants in the air.  A foreign object stuck in the nose.  A fungal infection. This is rare. What increases the risk? The following factors may make you more likely to develop this condition:  Having allergies or asthma.  Having had a recent cold or respiratory tract infection.  Having structural deformities or blockages in your nose or sinuses.  Having a weak immune system.  Doing a lot of swimming or diving.  Overusing nasal sprays.  Smoking. What are the signs  or symptoms? The main symptoms of this condition are pain and a feeling of pressure around the affected sinuses. Other symptoms include:  Upper toothache.  Earache.  Headache.  Bad breath.  Decreased sense of smell and taste.  A cough that may get worse at night.  Fatigue.  Fever.  Thick drainage from your nose. The drainage is often green and it may contain pus (purulent).  Stuffy nose or congestion.  Postnasal drip. This is when extra mucus collects in the throat or back of the nose.  Swelling and warmth over the affected sinuses.  Sore throat.  Sensitivity to light. How is this diagnosed? This condition is diagnosed based on symptoms, a medical history, and a physical exam. To find out if your condition is acute or chronic, your health care provider may:  Look in your nose for signs of nasal polyps.  Tap over the affected sinus to check for signs of infection.  View the inside of your sinuses using an imaging device that has a light attached (endoscope). If your health care provider suspects that you have chronic sinusitis, you may also:  Be tested for allergies.  Have a sample of mucus taken from your nose (nasal culture) and checked for bacteria.  Have a mucus sample examined to see if your sinusitis is related to an allergy. If your sinusitis does not respond to treatment and it lasts longer than 8 weeks, you may have an MRI or CT scan to check your sinuses. These scans also help to determine how severe your  infection is. In rare cases, a bone biopsy may be done to rule out more serious types of fungal sinus disease. How is this treated? Treatment for sinusitis depends on the cause and whether your condition is chronic or acute. If a virus is causing your sinusitis, your symptoms will go away on their own within 10 days. You may be given medicines to relieve your symptoms, including:  Topical nasal decongestants. They shrink swollen nasal passages and let mucus  drain from your sinuses.  Antihistamines. These drugs block inflammation that is triggered by allergies. This can help to ease swelling in your nose and sinuses.  Topical nasal corticosteroids. These are nasal sprays that ease inflammation and swelling in your nose and sinuses.  Nasal saline washes. These rinses can help to get rid of thick mucus in your nose. If your condition is caused by bacteria, you will be given an antibiotic medicine. If your condition is caused by a fungus, you will be given an antifungal medicine. Surgery may be needed to correct underlying conditions, such as narrow nasal passages. Surgery may also be needed to remove polyps. Follow these instructions at home: Medicines  Take, use, or apply over-the-counter and prescription medicines only as told by your health care provider. These may include nasal sprays.  If you were prescribed an antibiotic medicine, take it as told by your health care provider. Do not stop taking the antibiotic even if you start to feel better. Hydrate and Humidify  Drink enough water to keep your urine clear or pale yellow. Staying hydrated will help to thin your mucus.  Use a cool mist humidifier to keep the humidity level in your home above 50%.  Inhale steam for 10-15 minutes, 3-4 times a day or as told by your health care provider. You can do this in the bathroom while a hot shower is running.  Limit your exposure to cool or dry air. Rest  Rest as much as possible.  Sleep with your head raised (elevated).  Make sure to get enough sleep each night. General instructions  Apply a warm, moist washcloth to your face 3-4 times a day or as told by your health care provider. This will help with discomfort.  Wash your hands often with soap and water to reduce your exposure to viruses and other germs. If soap and water are not available, use hand sanitizer.  Do not smoke. Avoid being around people who are smoking (secondhand  smoke).  Keep all follow-up visits as told by your health care provider. This is important. Contact a health care provider if:  You have a fever.  Your symptoms get worse.  Your symptoms do not improve within 10 days. Get help right away if:  You have a severe headache.  You have persistent vomiting.  You have pain or swelling around your face or eyes.  You have vision problems.  You develop confusion.  Your neck is stiff.  You have trouble breathing. This information is not intended to replace advice given to you by your health care provider. Make sure you discuss any questions you have with your health care provider. Document Released: 11/20/2005 Document Revised: 07/16/2016 Document Reviewed: 09/15/2015 Elsevier Interactive Patient Education  2017 [employer].

## 2017-02-01 ENCOUNTER — Telehealth: Payer: Self-pay

## 2017-02-01 ENCOUNTER — Other Ambulatory Visit: Payer: Self-pay

## 2017-02-01 MED ORDER — SILDENAFIL CITRATE 20 MG PO TABS: 20.0000 mg | ORAL_TABLET | ORAL | 11 refills | Status: DC | PRN

## 2017-02-01 NOTE — Telephone Encounter (Signed)
Called pt regarding Pre Authorization for sildenafil. Pt advised to [facility]. Pt agreed. Rx sent to pharmacy per Dr. Denyse Amassorey.  Pt had understanding and was agreeable.

## 2017-02-07 ENCOUNTER — Ambulatory Visit (INDEPENDENT_AMBULATORY_CARE_PROVIDER_SITE_OTHER): Payer: BLUE CROSS/BLUE SHIELD | Admitting: Family Medicine

## 2017-02-07 DIAGNOSIS — J329 Chronic sinusitis, unspecified: Secondary | ICD-10-CM | POA: Diagnosis not present

## 2017-02-07 MED ORDER — LEVOFLOXACIN 500 MG PO TABS: 500.0000 mg | ORAL_TABLET | Freq: Every day | ORAL | 0 refills | Status: DC

## 2017-02-07 MED ORDER — PREDNISONE 10 MG PO TABS: 30.0000 mg | ORAL_TABLET | Freq: Every day | ORAL | 0 refills | Status: DC

## 2017-02-07 NOTE — Patient Instructions (Addendum)
Thank you for coming in today. Take prednisone and Levaquin.  Recheck as needed.  You should hear from the ENT doctor soon.  Let me know if you do not hear anything.  Also start taking over the counter zyrtec (certizine) daily.    Call or go to the emergency room if you get worse, have trouble breathing, have chest pains, or palpitations.    Sinusitis, Adult Sinusitis is soreness and inflammation of your sinuses. Sinuses are hollow spaces in the bones around your face. Your sinuses are located:  Around your eyes.  In the middle of your forehead.  Behind your nose.  In your cheekbones. Your sinuses and nasal passages are lined with a stringy fluid (mucus). Mucus normally drains out of your sinuses. When your nasal tissues become inflamed or swollen, the mucus can become trapped or blocked so air cannot flow through your sinuses. This allows bacteria, viruses, and funguses to grow, which leads to infection. Sinusitis can develop quickly and last for 7?10 days (acute) or for more than 12 weeks (chronic). Sinusitis often develops after a cold. What are the causes? This condition is caused by anything that creates swelling in the sinuses or stops mucus from draining, including:  Allergies.  Asthma.  Bacterial or viral infection.  Abnormally shaped bones between the nasal passages.  Nasal growths that contain mucus (nasal polyps).  Narrow sinus openings.  Pollutants, such as chemicals or irritants in the air.  A foreign object stuck in the nose.  A fungal infection. This is rare. What increases the risk? The following factors may make you more likely to develop this condition:  Having allergies or asthma.  Having had a recent cold or respiratory tract infection.  Having structural deformities or blockages in your nose or sinuses.  Having a weak immune system.  Doing a lot of swimming or diving.  Overusing nasal sprays.  Smoking. What are the signs or  symptoms? The main symptoms of this condition are pain and a feeling of pressure around the affected sinuses. Other symptoms include:  Upper toothache.  Earache.  Headache.  Bad breath.  Decreased sense of smell and taste.  A cough that may get worse at night.  Fatigue.  Fever.  Thick drainage from your nose. The drainage is often green and it may contain pus (purulent).  Stuffy nose or congestion.  Postnasal drip. This is when extra mucus collects in the throat or back of the nose.  Swelling and warmth over the affected sinuses.  Sore throat.  Sensitivity to light. How is this diagnosed? This condition is diagnosed based on symptoms, a medical history, and a physical exam. To find out if your condition is acute or chronic, your health care provider may:  Look in your nose for signs of nasal polyps.  Tap over the affected sinus to check for signs of infection.  View the inside of your sinuses using an imaging device that has a light attached (endoscope). If your health care provider suspects that you have chronic sinusitis, you may also:  Be tested for allergies.  Have a sample of mucus taken from your nose (nasal culture) and checked for bacteria.  Have a mucus sample examined to see if your sinusitis is related to an allergy. If your sinusitis does not respond to treatment and it lasts longer than 8 weeks, you may have an MRI or CT scan to check your sinuses. These scans also help to determine how severe your infection is. In rare cases,  a bone biopsy may be done to rule out more serious types of fungal sinus disease. How is this treated? Treatment for sinusitis depends on the cause and whether your condition is chronic or acute. If a virus is causing your sinusitis, your symptoms will go away on their own within 10 days. You may be given medicines to relieve your symptoms, including:  Topical nasal decongestants. They shrink swollen nasal passages and let mucus  drain from your sinuses.  Antihistamines. These drugs block inflammation that is triggered by allergies. This can help to ease swelling in your nose and sinuses.  Topical nasal corticosteroids. These are nasal sprays that ease inflammation and swelling in your nose and sinuses.  Nasal saline washes. These rinses can help to get rid of thick mucus in your nose. If your condition is caused by bacteria, you will be given an antibiotic medicine. If your condition is caused by a fungus, you will be given an antifungal medicine. Surgery may be needed to correct underlying conditions, such as narrow nasal passages. Surgery may also be needed to remove polyps. Follow these instructions at home: Medicines   Take, use, or apply over-the-counter and prescription medicines only as told by your health care provider. These may include nasal sprays.  If you were prescribed an antibiotic medicine, take it as told by your health care provider. Do not stop taking the antibiotic even if you start to feel better. Hydrate and Humidify   Drink enough water to keep your urine clear or pale yellow. Staying hydrated will help to thin your mucus.  Use a cool mist humidifier to keep the humidity level in your home above 50%.  Inhale steam for 10-15 minutes, 3-4 times a day or as told by your health care provider. You can do this in the bathroom while a hot shower is running.  Limit your exposure to cool or dry air. Rest   Rest as much as possible.  Sleep with your head raised (elevated).  Make sure to get enough sleep each night. General instructions   Apply a warm, moist washcloth to your face 3-4 times a day or as told by your health care provider. This will help with discomfort.  Wash your hands often with soap and water to reduce your exposure to viruses and other germs. If soap and water are not available, use hand sanitizer.  Do not smoke. Avoid being around people who are smoking (secondhand  smoke).  Keep all follow-up visits as told by your health care provider. This is important. Contact a health care provider if:  You have a fever.  Your symptoms get worse.  Your symptoms do not improve within 10 days. Get help right away if:  You have a severe headache.  You have persistent vomiting.  You have pain or swelling around your face or eyes.  You have vision problems.  You develop confusion.  Your neck is stiff.  You have trouble breathing. This information is not intended to replace advice given to you by your health care provider. Make sure you discuss any questions you have with your health care provider. Document Released: 11/20/2005 Document Revised: 07/16/2016 Document Reviewed: 09/15/2015 Elsevier Interactive Patient Education  2017 [employer]

## 2017-02-07 NOTE — Progress Notes (Signed)
Albert Mills is a 56 y.o. male who [facility] today for sinus congestion pain pressure. Patient was seen February 26 for recurrent maxillary sinusitis. He was prescribed Omnicef which didn't help much and caused diarrhea. He notes continued congestion and pain. He denies fevers chills vomiting or diarrhea. He feels well otherwise.   Past Medical History:  Diagnosis Date  . HTN (hypertension)   . Hyperlipemia    No past surgical history on file. Social History  Substance Use Topics  . Smoking status: Never Smoker  . Smokeless tobacco: Never Used  . Alcohol use Yes   family history includes Heart disease in his paternal grandfather and paternal uncle; Lupus in his mother and sister.  ROS as above:  Medications: Current Outpatient Prescriptions  Medication Sig Dispense Refill  . AMBULATORY NON FORMULARY MEDICATION Continuous positive airway pressure (CPAP) auto titrate to a maximum pressure of 20 cm of H2O pressure, with all supplemental supplies as needed. 1 each 0  . amLODipine (NORVASC) 10 MG tablet Take 1 tablet (10 mg total) by mouth daily. 90 tablet 3  . atorvastatin (LIPITOR) 40 MG tablet Take 1 tablet (40 mg total) by mouth daily at 6 PM. 90 tablet 3  . fluticasone (FLONASE) 50 MCG/ACT nasal spray Place 2 sprays into both nostrils daily. 16 g 2  . olmesartan-hydrochlorothiazide (BENICAR HCT) 40-25 MG tablet Take 1 tablet by mouth daily. 30 tablet 1  . omeprazole (PRILOSEC) 40 MG capsule Take 1 capsule (40 mg total) by mouth daily. 90 capsule 3  . sildenafil (REVATIO) 20 MG tablet Take 1 tablet (20 mg total) by mouth as needed. 50 tablet 11  . levofloxacin (LEVAQUIN) 500 MG tablet Take 1 tablet (500 mg total) by mouth daily. 10 tablet 0  . predniSONE (DELTASONE) 10 MG tablet Take 3 tablets (30 mg total) by mouth daily with breakfast. 15 tablet 0     No current facility-administered medications for this visit.    Allergies  Allergen Reactions  . Lisinopril Other (See Comments)    Cough    Health Maintenance Health Maintenance  Topic Date Due  . COLONOSCOPY  04/09/2011  . INFLUENZA VACCINE  08/04/2017 (Originally 07/04/2016)  . HIV Screening  06/26/2029 (Originally 04/08/1976)  . TETANUS/TDAP  06/26/2026  . Hepatitis C Screening  Completed     Exam:  BP (!) 143/90   Pulse 89   Wt (!) 327 lb (148.3 kg)   BMI 43.14 kg/m  Gen: Well NAD Morbidly obese HEENT: EOMI,  MMM tender palpation bilateral maxillary sinuses. Inflamed nasal turbinates with clear discharge present. Lungs: Normal work of breathing. CTABL Heart: RRR no MRG Abd: NABS, Soft. Nondistended, Nontender Exts: Brisk capillary refill, warm and well perfused.    No results found for this or any previous visit (from the past 72 hour(s)). No results found.    Assessment and Plan: 56 y.o. male with recurrent maxillary sinusitis. Patient failed Omnicef. Will switch Levaquin to use short course prednisone. Additionally refer to ENT for recurrent sinusitis.   Orders Placed This Encounter  Procedures  . Ambulatory referral to ENT    Referral Priority:   Routine    Referral Type:   Consultation    Referral Reason:   Specialty Services Required    Requested Specialty:   Otolaryngology    Number of Visits Requested:   1   Meds ordered this encounter  Medications  . predniSONE (DELTASONE) 10  MG tablet    Sig: Take 3 tablets (30 mg total) by mouth daily with breakfast.    Dispense:  15 tablet    Refill:  0  . levofloxacin (LEVAQUIN) 500 MG tablet    Sig: Take 1 tablet (500 mg total) by mouth daily.    Dispense:  10 tablet    Refill:  0     Discussed warning signs or symptoms. Please see discharge instructions. Patient expresses understanding.

## 2017-02-09 DIAGNOSIS — G4733 Obstructive sleep apnea (adult) (pediatric): Secondary | ICD-10-CM | POA: Diagnosis not present

## 2017-02-27 DIAGNOSIS — J343 Hypertrophy of nasal turbinates: Secondary | ICD-10-CM | POA: Diagnosis not present

## 2017-02-27 DIAGNOSIS — J3489 Other specified disorders of nose and nasal sinuses: Secondary | ICD-10-CM | POA: Diagnosis not present

## 2017-02-28 DIAGNOSIS — J328 Other chronic sinusitis: Secondary | ICD-10-CM | POA: Diagnosis not present

## 2017-03-12 DIAGNOSIS — G4733 Obstructive sleep apnea (adult) (pediatric): Secondary | ICD-10-CM | POA: Diagnosis not present

## 2017-03-27 DIAGNOSIS — J3489 Other specified disorders of nose and nasal sinuses: Secondary | ICD-10-CM | POA: Diagnosis not present

## 2017-03-27 DIAGNOSIS — J343 Hypertrophy of nasal turbinates: Secondary | ICD-10-CM | POA: Diagnosis not present

## 2017-09-14 ENCOUNTER — Ambulatory Visit (INDEPENDENT_AMBULATORY_CARE_PROVIDER_SITE_OTHER): Payer: BLUE CROSS/BLUE SHIELD | Admitting: Family Medicine

## 2017-09-14 ENCOUNTER — Encounter: Payer: Self-pay | Admitting: Family Medicine

## 2017-09-14 VITALS — BP 135/83 | HR 92 | Ht 73.0 in | Wt 322.0 lb

## 2017-09-14 DIAGNOSIS — E782 Mixed hyperlipidemia: Secondary | ICD-10-CM | POA: Diagnosis not present

## 2017-09-14 DIAGNOSIS — Z125 Encounter for screening for malignant neoplasm of prostate: Secondary | ICD-10-CM

## 2017-09-14 DIAGNOSIS — I10 Essential (primary) hypertension: Secondary | ICD-10-CM

## 2017-09-14 MED ORDER — AMLODIPINE BESYLATE 10 MG PO TABS: 10.0000 mg | ORAL_TABLET | Freq: Every day | ORAL | 3 refills | Status: DC

## 2017-09-14 NOTE — Patient Instructions (Addendum)
Thank you for coming in today. Get labs today.  Continue weight loss.  Recheck in 6 months or sooner if needed.   For the hamstring do the exercise series.   The Askling L Protocol for Hamstring Strains  Leeroy Cha PT  Find it on [employer]

## 2017-09-14 NOTE — Progress Notes (Signed)
Albert Mills is a 56 y.o. male who presents to [name] today for well adult visit. Albert Mills is doing pretty well over the last several months. He has tried to improve his diet and increase his exercise and has managed to lose some weight. He however has not been taking his Lipitor. He's not been taking his amlodipine regularly either. He did fill the amlodipine and took it last night. He denies chest pain palpitations or shortness of breath.   Past Medical History:  Diagnosis Date  . HTN (hypertension)   . Hyperlipemia    No past surgical history on file. Social History  Substance Use Topics  . Smoking status: Never Smoker  . Smokeless tobacco: Never Used  . Alcohol use Yes   family history includes Heart disease in his paternal grandfather and paternal uncle; Lupus in his mother and sister.  ROS as above:  Medications: Current Outpatient Prescriptions  Medication Sig Dispense Refill  . amLODipine (NORVASC) 10 MG tablet Take 1 tablet (10 mg total) by mouth daily. 90 tablet 3  . atorvastatin (LIPITOR) 40 MG tablet Take 1 tablet (40 mg total) by mouth daily at 6 PM. (Patient not taking: Reported on 09/14/2017) 90 tablet 3  . fluticasone (FLONASE) 50 MCG/ACT nasal spray Place 2 sprays into both nostrils daily. (Patient not taking: Reported on 09/14/2017) 16 g 2   No current facility-administered medications for this visit.    Allergies  Allergen Reactions  . Lisinopril Other (See Comments)    Cough    Health Maintenance Health Maintenance  Topic Date Due  . COLONOSCOPY  04/09/2011  . INFLUENZA VACCINE  09/14/2018 (Originally 07/04/2017)  . HIV Screening  06/26/2029 (Originally 04/08/1976)  . TETANUS/TDAP  06/26/2026  . Hepatitis C Screening  Completed     Exam:  BP 135/83   Pulse 92   Ht  (1.854 m)   Wt (!) 322 lb (146.1 kg)   BMI 42.48 kg/m     Wt Readings from Last 10 Encounters:  09/14/17 (!) 322 lb (146.1 kg)  02/07/17 (!) 327 lb (148.3 kg)  01/29/17 (!) 334 lb (151.5 kg)  11/14/16 (!) 320 lb (145.2 kg)  08/01/16 (!) 332 lb (150.6 kg)  06/26/16 (!) 330 lb (149.7 kg)    Gen: Well NAD Obese HEENT: EOMI,  MMM Lungs: Normal work of breathing. CTABL Heart: RRR no MRG Abd: NABS, Soft. Nondistended, Nontender Exts: Brisk capillary refill, warm and well perfused.    No results found for this or any previous visit (from the past 72 hour(s)). No results found.    Assessment and Plan: 56 y.o. male with  Well adult. Doing reasonably well. Patient's obesity is the main medical problem. Plan to check basic fasting labs today. Continue amlodipine. Patient declined the influenza vaccine. [facility] Plan to recheck in about 6 months or sooner if needed.   Orders Placed This Encounter  Procedures  . CBC  . COMPLETE METABOLIC PANEL WITH GFR  . Lipid Panel w/reflex Direct LDL  . Hemoglobin A1c  . Uric acid  . PSA   Meds ordered this encounter  Medications  . amLODipine (NORVASC) 10 MG tablet    Sig: Take 1 tablet (10 mg total) by mouth daily.    Dispense:  90 tablet    Refill:  3     Discussed warning signs or symptoms. Please see discharge instructions.  Patient expresses understanding.

## 2017-09-15 LAB — COMPLETE METABOLIC PANEL WITH GFR
AG Ratio: 1.7 (calc) (ref 1.0–2.5)
ALKALINE PHOSPHATASE (APISO): 77 U/L (ref 40–115)
ALT: 15 U/L (ref 9–46)
AST: 18 U/L (ref 10–35)
Albumin: 4 g/dL (ref 3.6–5.1)
BUN: 13 mg/dL (ref 7–25)
CALCIUM: 9.3 mg/dL (ref 8.6–10.3)
CO2: 25 mmol/L (ref 20–32)
CREATININE: 0.97 mg/dL (ref 0.70–1.33)
Chloride: 106 mmol/L (ref 98–110)
GFR, EST AFRICAN AMERICAN: 101 mL/min/{1.73_m2} (ref >=60)
GFR, EST NON AFRICAN AMERICAN: 87 mL/min/{1.73_m2} (ref >=60)
GLOBULIN: 2.4 g/dL (ref 1.9–3.7)
GLUCOSE: 88 mg/dL (ref 65–99)
Potassium: 4 mmol/L (ref 3.5–5.3)
SODIUM: 139 mmol/L (ref 135–146)
TOTAL PROTEIN: 6.4 g/dL (ref 6.1–8.1)
Total Bilirubin: 0.7 mg/dL (ref 0.2–1.2)

## 2017-09-15 LAB — LIPID PANEL W/REFLEX DIRECT LDL
CHOL/HDL RATIO: 3.4 (calc) (ref <5.0)
CHOLESTEROL: 200 mg/dL — AB (ref <200)
HDL: 59 mg/dL (ref >40)
LDL CHOLESTEROL (CALC): 122 mg/dL — AB
NON-HDL CHOLESTEROL (CALC): 141 mg/dL — AB (ref <130)
Triglycerides: 93 mg/dL (ref <150)

## 2017-09-15 LAB — CBC
HCT: 47.7 % (ref 38.5–50.0)
Hemoglobin: 16 g/dL (ref 13.2–17.1)
MCH: 30.3 pg (ref 27.0–33.0)
MCHC: 33.5 g/dL (ref 32.0–36.0)
MCV: 90.3 fL (ref 80.0–100.0)
MPV: 10.1 fL (ref 7.5–12.5)
PLATELETS: 188 10*3/uL (ref 140–400)
RBC: 5.28 10*6/uL (ref 4.20–5.80)
RDW: 13.5 % (ref 11.0–15.0)
WBC: 5.3 10*3/uL (ref 3.8–10.8)

## 2017-09-15 LAB — URIC ACID: URIC ACID, SERUM: 8.1 mg/dL — AB (ref 4.0–8.0)

## 2017-09-15 LAB — HEMOGLOBIN A1C
Hgb A1c MFr Bld: 5.1 % of total Hgb (ref <5.7)
Mean Plasma Glucose: 100 (calc)
eAG (mmol/L): 5.5 (calc)

## 2017-09-15 LAB — PSA: PSA: 0.5 ng/mL (ref <=4.0)

## 2017-10-04 ENCOUNTER — Ambulatory Visit (INDEPENDENT_AMBULATORY_CARE_PROVIDER_SITE_OTHER): Payer: BLUE CROSS/BLUE SHIELD | Admitting: Family Medicine

## 2017-10-04 ENCOUNTER — Ambulatory Visit (INDEPENDENT_AMBULATORY_CARE_PROVIDER_SITE_OTHER): Payer: BLUE CROSS/BLUE SHIELD

## 2017-10-04 ENCOUNTER — Encounter: Payer: Self-pay | Admitting: Family Medicine

## 2017-10-04 DIAGNOSIS — M5117 Intervertebral disc disorders with radiculopathy, lumbosacral region: Secondary | ICD-10-CM

## 2017-10-04 DIAGNOSIS — M545 Low back pain, unspecified: Secondary | ICD-10-CM

## 2017-10-04 DIAGNOSIS — M5416 Radiculopathy, lumbar region: Secondary | ICD-10-CM | POA: Diagnosis not present

## 2017-10-04 DIAGNOSIS — M25551 Pain in right hip: Secondary | ICD-10-CM

## 2017-10-04 MED ORDER — CYCLOBENZAPRINE HCL 10 MG PO TABS: 10.0000 mg | ORAL_TABLET | Freq: Three times a day (TID) | ORAL | 1 refills | Status: DC

## 2017-10-04 NOTE — Progress Notes (Signed)
Albert Mills is a 56 y.o. male who [facility] today for back pain.   Linton notes chronic axial back pain ongoing for years worsening over the last few weeks.  He notes the pain worsened after he fell on his buttocks.  He notes the pain is worse with activity and better with rest.  He denies significant radiating pain weakness or numbness.  He denies any bowel or bladder dysfunction.  He notes some pain radiating along the anterior right thigh worse with hip motion.  He denies any fevers or chills and has tried ibuprofen which does help.   Past Medical History:  Diagnosis Date  . HTN (hypertension)   . Hyperlipemia    No past surgical history on file. Social History  Substance Use Topics  . Smoking status: Never Smoker  . Smokeless tobacco: Never Used  . Alcohol use Yes     ROS:  As above   Medications: Current Outpatient Prescriptions  Medication Sig Dispense Refill  . amLODipine (NORVASC) 10 MG tablet Take 1 tablet (10 mg total) by mouth daily. 90 tablet 3  . atorvastatin (LIPITOR) 40 MG tablet Take 1 tablet (40 mg total) by mouth daily at 6 PM. 90 tablet 3  . fluticasone (FLONASE) 50 MCG/ACT nasal spray Place 2 sprays into both nostrils daily. 16 g 2  . cyclobenzaprine (FLEXERIL) 10 MG tablet Take 1 tablet (10 mg total) by mouth 3 (three) times daily. 90 tablet 1   No current facility-administered medications for this visit.    Allergies  Allergen Reactions  . Lisinopril Other (See Comments)    Cough     Exam:  BP 121/71   Pulse 80   Wt (!) 320 lb (145.2 kg)   BMI 42.22 kg/m  General: Well Developed, well nourished, and in no acute distress.  Neuro/Psych: Alert and oriented x3, extra-ocular muscles intact, able to move all 4 extremities, sensation grossly intact. Skin: Warm and dry, no rashes noted.  Respiratory: Not using accessory muscles, speaking in full sentences, trachea midline.  Cardiovascular:  Pulses palpable, no extremity edema. Abdomen: Does not appear distended. MSK:  L-spine: Nontender to spinal midline.  Tender to palpation bilateral lumbar paraspinals. Lumbar motion is intact however patient experiences pain with extension Lower extremity strength is intact Reflexes and sensation are intact bilaterally Normal gait.  Right hip: Normal-appearing nontender some pain with internal rotational range of motion testing flexion testing.  X-ray right hip mild DJD otherwise normal-appearing  X-ray L-spine loss of lumbar lordosis indicating spasm present.  Degenerative changes more pronounced at L4-L5 and L5-S1.  No fractures seen.  Formal radiology review pending    No results found for this or any previous visit (from the past 48 hour(s)). No results found.    Assessment and Plan: 56 y.o. male with back pain: Exacerbation of existing chronic pain likely due to myofascial disruption.  Plan for referral to physical therapy and trial of Flexeril in addition to home prescription strength NSAIDs. Plan to recheck in 6 weeks.  If not better will proceed with MRI for facet injection planning.    Orders Placed This Encounter  Procedures  . DG Lumbar Spine Complete    Standing Status:   Future    Number of Occurrences:   1    Standing Expiration Date:   12/04/2018    Order Specific Question:   Reason for Exam (SYMPTOM  OR DIAGNOSIS REQUIRED)    Answer:   eval  pain lspine    Order Specific Question:   Preferred imaging location?    Answer:   [facility]    Order Specific Question:   Radiology Contrast Protocol - do NOT remove file path    Answer:   \\[email or web address]  . DG Pelvis 1-2 Views    Standing Status:   Future    Number of Occurrences:   1    Standing Expiration Date:   12/04/2018    Order Specific Question:   Reason for Exam (SYMPTOM  OR DIAGNOSIS REQUIRED)    Answer:   Eval right hip pain    Order Specific  Question:   Preferred imaging location?    Answer:   [facility]    Order Specific Question:   Radiology Contrast Protocol - do NOT remove file path    Answer:   \\[email or web address].[email or web address]  . Ambulatory referral to Physical Therapy    Referral Priority:   Routine    Referral Type:   Physical Medicine    Referral Reason:   Specialty Services Required    Requested Specialty:   Physical Therapy   Meds ordered this encounter  Medications  . cyclobenzaprine (FLEXERIL) 10 MG tablet    Sig: Take 1 tablet (10 mg total) by mouth 3 (three) times daily.    Dispense:  90 tablet    Refill:  1    Discussed warning signs or symptoms. Please see discharge instructions. Patient expresses understanding.

## 2017-10-04 NOTE — Patient Instructions (Signed)
Thank you for coming in today. Attend PT.  Use muscle relexer as needed up to 3x daily.  Use it mostly at night.,  Continue Ibuprofen as needed.  Use a heating pad.   Use a TENS unit.  TENS UNIT: This is helpful for muscle pain and spasm.   Search and Purchase a TENS 7000 2nd edition at  www.tenspros.com or www.Amazon.com It should be less than $30.     TENS unit instructions: Do not shower or bathe with the unit on Turn the unit off before removing electrodes or batteries If the electrodes lose stickiness add a drop of water to the electrodes after they are disconnected from the unit and place on plastic sheet. If you continued to have difficulty, call the TENS unit company to purchase more electrodes. Do not apply lotion on the skin area prior to use. Make sure the skin is clean and dry as this will help prolong the life of the electrodes. After use, always check skin for unusual red areas, rash or other skin difficulties. If there are any skin problems, does not apply electrodes to the same area. Never remove the electrodes from the unit by pulling the wires. Do not use the TENS unit or electrodes other than as directed. Do not change electrode placement without consultating your therapist or physician. Keep 2 fingers with between each electrode. Wear time ratio is 2:1, on to off times.    For example on for 30 minutes off for 15 minutes and then on for 30 minutes off for 15 minutes    Recheck in 6 weeks or sooner if needed.    Lumbosacral Strain Lumbosacral strain is an injury that causes pain in the lower back (lumbosacral spine). This injury usually occurs from overstretching the muscles or ligaments along your spine. A strain can affect one or more muscles or cord-like tissues that connect bones to other bones (ligaments). What are the causes? This condition may be caused by:  A hard, direct hit (blow) to the back.  Excessive stretching of the lower back muscles. This  may result from: ? A fall. ? Lifting something heavy. ? Repetitive movements such as bending or crouching.  What increases the risk? The following factors may increase your risk of getting this condition:  Participating in sports or activities that involve: ? A sudden twist of the back. ? Pushing or pulling motions.  Being overweight or obese.  Having poor strength and flexibility, especially tight hamstrings or weak muscles in the back or abdomen.  Having too much of a curve in the lower back.  Having a pelvis that is tilted forward.  What are the signs or symptoms? The main symptom of this condition is pain in the lower back, at the site of the strain. Pain may extend (radiate) down one or both legs. How is this diagnosed? This condition is diagnosed based on:  Your symptoms.  Your medical history.  A physical exam. ? Your health care provider may push on certain areas of your back to determine the source of your pain. ? You may be asked to bend forward, backward, and side to side to assess the severity of your pain and your range of motion.  Imaging tests, such as: ? X-rays. ? MRI.  How is this treated? Treatment for this condition may include:  Putting heat and cold on the affected area.  Medicines to help relieve pain and relax your muscles (muscle relaxants).  NSAIDs to help reduce swelling and  discomfort.  When your symptoms improve, it is important to gradually return to your normal routine as soon as possible to reduce pain, avoid stiffness, and avoid loss of muscle strength. Generally, symptoms should improve within 6 weeks of treatment. However, recovery time varies. Follow these instructions at home: Managing pain, stiffness, and swelling   If directed, put ice on the injured area during the first 24 hours after your strain. ? Put ice in a plastic bag. ? Place a towel between your skin and the bag. ? Leave the ice on for 20 minutes, 2-3 times a  day.  If directed, put heat on the affected area as often as told by your health care provider. Use the heat source that your health care provider recommends, such as a moist heat pack or a heating pad. ? Place a towel between your skin and the heat source. ? Leave the heat on for 20-30 minutes. ? Remove the heat if your skin turns bright red. This is especially important if you are unable to feel pain, heat, or cold. You may have a greater risk of getting burned. Activity  Rest and return to your normal activities as told by your health care provider. Ask your health care provider what activities are safe for you.  Avoid activities that take a lot of energy for as long as told by your health care provider. General instructions  Take over-the-counter and prescription medicines only as told by your health care provider.  Donot drive or use heavy machinery while taking prescription pain medicine.  Do not use any products that contain nicotine or tobacco, such as cigarettes and e-cigarettes. If you need help quitting, ask your health care provider.  Keep all follow-up visits as told by your health care provider. This is important. How is this prevented?  Use correct form when playing sports and lifting heavy objects.  Use good posture when sitting and standing.  Maintain a healthy weight.  Sleep on a mattress with medium firmness to support your back.  Be safe and responsible while being active to avoid falls.  Do at least 150 minutes of moderate-intensity exercise each week, such as brisk walking or water aerobics. Try a form of exercise that takes stress off your back, such as swimming or stationary cycling.  Maintain physical fitness, including: ? Strength. ? Flexibility. ? Cardiovascular fitness. ? Endurance. Contact a health care provider if:  Your back pain does not improve after 6 weeks of treatment.  Your symptoms get worse. Get help right away if:  Your back pain  is severe.  You cannot stand or walk.  You have difficulty controlling when you urinate or when you have a bowel movement.  You feel nauseous or you vomit.  Your feet get very cold.  You have numbness, tingling, weakness, or problems using your arms or legs.  You develop any of the following: ? Shortness of breath. ? Dizziness. ? Pain in your legs. ? Weakness in your buttocks or legs. ? Discoloration of the skin on your toes or legs. This information is not intended to replace advice given to you by your health care provider. Make sure you discuss any questions you have with your health care provider. Document Released: 08/30/2005 Document Revised: 06/09/2016 Document Reviewed: 04/23/2016 Elsevier Interactive Patient Education  2017 [employer].

## 2017-10-23 ENCOUNTER — Ambulatory Visit: Payer: BLUE CROSS/BLUE SHIELD | Admitting: Rehabilitative and Restorative Service Providers"

## 2017-10-23 ENCOUNTER — Encounter: Payer: Self-pay | Admitting: Rehabilitative and Restorative Service Providers"

## 2017-10-23 DIAGNOSIS — M533 Sacrococcygeal disorders, not elsewhere classified: Secondary | ICD-10-CM

## 2017-10-23 DIAGNOSIS — R29898 Other symptoms and signs involving the musculoskeletal system: Secondary | ICD-10-CM

## 2017-10-23 NOTE — Therapy (Addendum)
[facility] [place], [place], [address] Phone: [phone]   Fax:  [phone]  Physical Therapy Evaluation  Patient Details  Name: Albert Mills MRN: [id number] Date of Birth: Jul 09, 1961 Referring Provider: Dr Georgina Snell    Encounter Date: 10/23/2017  PT End of Session - 10/23/17 1516    Visit Number  1    Number of Visits  12    Date for PT Re-Evaluation  12/05/17    PT Start Time  1430    PT Stop Time  8099    PT Time Calculation (min)  44 min    Activity Tolerance  Patient tolerated treatment well       Past Medical History:  Diagnosis Date  . HTN (hypertension)   . Hyperlipemia     History reviewed. No pertinent surgical history.  There were no vitals filed for this visit.   Subjective Assessment - 10/23/17 1440    Subjective  Albert Mills reports that he has had LBP for 20-30 years but in the past 2-3 months. Does remember falling ~ 3-4 months ago when he slipped on water and landed on his buttocks. Pain has increased in the past few weeks. He has had pain in the tailbone area but now more pain is in the Rt groin area.     Pertinent History  chronic LBP; tingling and sharp pain in the Rt LE for ~ 1-2 years unknown cause; chiropractic care for ~ 25 years with adjustments every few months; HTN    How long can you sit comfortably?  30 min     How long can you stand comfortably?  30 min     How long can you walk comfortably?  30 min     Diagnostic tests  xrays     Patient Stated Goals  stop the pain in the LB    Currently in Pain?  Yes    Pain Score  5     Pain Location  Back    Pain Orientation  Lower;Mid    Pain Descriptors / Indicators  Sharp    Pain Type  Acute pain    Pain Radiating Towards  Rt buttocks posteriorly into the Rt groin     Pain Onset  More than a month ago    Pain Frequency  Intermittent    Aggravating Factors   prolonged postures or positions; difficult to sleep must sleep on Lt side      Pain Relieving Factors  advil; tylenol; mm relaxer         [facility] PT Assessment - 10/23/17 0001      Assessment   Medical Diagnosis  LBP; Rt groin pain     Referring Provider  Dr Georgina Snell     Onset Date/Surgical Date  07/04/17    Hand Dominance  Right    Next MD Visit  5/19    Prior Therapy  chiropractic care for years       Precautions   Precautions  None      Balance Screen   Has the patient fallen in the past 6 months  Yes    How many times?  1 slipped on water landing on tail bone     Has the patient had a decrease in activity level because of a fear of falling?   No    Is the patient reluctant to leave their home because of a fear of falling?   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     Leisure  yard work; golf ~ 1 x/month       Observation/Other Assessments   Focus on Therapeutic Outcomes (FOTO)   60% limitation       Sensation   Additional Comments  WFL's per pt report LE's - notes numbness in the arms when he is sleeping at night       Posture/Postural Control   Posture Comments  head forward; shoulders rounded and elevated; stands with LE's in ER       AROM   Right/Left Hip  -- tight end ranges - limited by clothing and weight     Lumbar Flexion  40%  pain in coccyx area into Rt groin     Lumbar Extension  30%    Lumbar - Right Side Bend  45% pain in coccyx and Rt groin     Lumbar - Left Side Bend  55%    Lumbar - Right Rotation  35%    Lumbar - Left Rotation  35%      Strength   Right Hip Flexion  5/5    Right Hip Extension  5/5    Right Hip ABduction  5/5    Left Hip Flexion  5/5    Left Hip Extension  5/5    Left Hip ABduction  5/5      Flexibility   Hamstrings  tight bilat ~ 60-65 deg      Quadriceps  WFL's and equal bilat     ITB  tight bilat     Piriformis  tight bilat       Palpation   Spinal mobility  WFL's and painfree through sacrum and lumbar spine with CPA mobs and lateral mobs in lumbar spine; painful with palpation in coccyx area     Palpation comment  lumbar and hip musculature WFL's and equal bilat - no pain or specific tightness       Special Tests    Special Tests  -- SLR; Fabers (-) bilat       Transfers   Comments  coccyx and Rt groin pain with all transfers including sit to stand and stand to sit as well as moving in and out of positions lying down and turning while lying              Objective measurements completed on examination: See above findings.      [facility] Adult PT Treatment/Exercise - 10/23/17 0001      Self-Care   Self-Care  -- education re pelvic floor/coccyx pain and rehab       Exercises   Exercises  -- prone glut set 5 sec hold x 10             PT Education - 10/23/17 1504    Education provided  Yes    Education Details  HEP; pelvic floor rehab     Person(s) Educated  Patient    Methods  Explanation;Handout;Verbal cues    Comprehension  Verbalized understanding;Returned demonstration;Verbal cues required          PT Long Term Goals - 10/23/17 1638      PT LONG TERM GOAL #1  Title  Goals to be estabilshed by pelvic pain speacialist with further evaluation     Time  1    Period  Weeks    Status  New             Plan - 10/23/17 1517    Clinical Impression Statement  Albert Mills presents with coccyx and Rt groin pain which has been present for the past 3 months, following a fall landing on his buttocks ~ 4 months ago. He has had chiropractic care wit hno improvement. OTC antiinflammatory meds help some with pain management. Patient has limited trunk and LE mobilty but all special testing is negative. He has pain with palpation to the coccyx. Patient will be referred to pelvic floor rehab therapist for  further diagnosis and treatment of coccyxdynia.     History and Personal Factors relevant to plan of care:  Chronic LBP x 30 years with frequent chiropractic care in the past 25 years    Clinical Presentation  Stable    Clinical Decision Making  Low    Rehab Potential  Good    PT Frequency  2x / week    PT Duration  6 weeks    PT Treatment/Interventions  Patient/family education;ADLs/Self Care Home Management;Cryotherapy;Electrical Stimulation;Iontophoresis 72m/ml Dexamethasone;Moist Heat;Ultrasound;Dry needling;Manual techniques;Neuromuscular re-education;Therapeutic activities;Therapeutic exercise    PT Next Visit Plan  referral to pelvic floor rehab for further treatment     Consulted and Agree with Plan of Care  Patient       Patient will benefit from skilled therapeutic intervention in order to improve the following deficits and impairments:  Decreased range of motion, Decreased mobility, Hypomobility, Pain, Decreased activity tolerance  Visit Diagnosis: Coccyxdynia - Plan: PT plan of care cert/re-cert  Other symptoms and signs involving the musculoskeletal system - Plan: PT plan of care cert/re-cert     Problem List Patient Active Problem List   Diagnosis Date Noted  . Recurrent sinusitis 02/07/2017  . OSA (obstructive sleep apnea) 12/25/2016  . Acid reflux 08/01/2016  . Framingham cardiac risk <10% in next 10 years 06/27/2016  . [place]bid [place] ([place] 06/26/2016  . Pain in joint, lower leg 06/26/2016  . Hyperlipidemia 03/06/2013  . Hypertension 03/06/2013    Celyn PNilda SimmerPT, MPH  10/23/2017, 4:48 PM  [facility] [place] [place] [address]Phone: [phone]  Fax:  [phone] Name: DJerimie MancusoMRN: [id number] of Birth: [birth year] PHYSICAL THERAPY DISCHARGE SUMMARY  Visits from Start of Care: Evaluation only  Current functional level related to goals / functional outcomes: See  evaluation for discharge status - unchanged   Remaining deficits: Unknown    Education / Equipment: HEP recommendation patient schedule with pelvic floor therapist to address coccyxdenia  Plan: Patient agrees to discharge.  Patient goals were not met. Patient is being discharged due to not returning since the last visit.  ?????    Celyn P. HHelene KelpPT, MPH 11/14/17 12:44 PM

## 2017-10-23 NOTE — Patient Instructions (Signed)
Gluteal Sets    Squeeze pelvic floor and hold. Tighten bottom. Hold for __10_ seconds. Relax for _2-3__ seconds. Repeat _10__ times. Do _2-3__ times a day.

## 2017-11-15 ENCOUNTER — Ambulatory Visit: Payer: BLUE CROSS/BLUE SHIELD | Admitting: Family Medicine

## 2017-11-15 DIAGNOSIS — Z0189 Encounter for other specified special examinations: Secondary | ICD-10-CM

## 2018-03-15 ENCOUNTER — Ambulatory Visit: Payer: BLUE CROSS/BLUE SHIELD | Admitting: Family Medicine

## 2018-03-15 ENCOUNTER — Encounter: Payer: Self-pay | Admitting: Family Medicine

## 2018-03-15 VITALS — BP 118/84 | HR 97 | Ht 73.0 in | Wt 313.0 lb

## 2018-03-15 DIAGNOSIS — N529 Male erectile dysfunction, unspecified: Secondary | ICD-10-CM | POA: Insufficient documentation

## 2018-03-15 DIAGNOSIS — E782 Mixed hyperlipidemia: Secondary | ICD-10-CM

## 2018-03-15 DIAGNOSIS — M545 Low back pain, unspecified: Secondary | ICD-10-CM | POA: Insufficient documentation

## 2018-03-15 DIAGNOSIS — Z6841 Body Mass Index (BMI) 40.0 and over, adult: Secondary | ICD-10-CM

## 2018-03-15 DIAGNOSIS — G8929 Other chronic pain: Secondary | ICD-10-CM | POA: Diagnosis not present

## 2018-03-15 DIAGNOSIS — I10 Essential (primary) hypertension: Secondary | ICD-10-CM | POA: Diagnosis not present

## 2018-03-15 MED ORDER — TADALAFIL 20 MG PO TABS: ORAL_TABLET | ORAL | 12 refills | Status: DC

## 2018-03-15 MED ORDER — CYCLOBENZAPRINE HCL 10 MG PO TABS: 10.0000 mg | ORAL_TABLET | Freq: Three times a day (TID) | ORAL | 1 refills | Status: AC

## 2018-03-15 NOTE — Progress Notes (Signed)
Albert Mills is a 57 y.o. male who [facility] today for follow-up obesity, hypertension, hyperlipidemia,, and erectile dysfunction.  Albert Mills has started a lifestyle change over the past month.  He is working with a integrative support program focusing on healthy lifestyle choices and decrease calories.  He is lost over 10 pounds and feels great.  He has increased his exercise as well and feels pretty happy with how things are going.  He notes he still needs to lose weight but is happy.  Hypertension: Albert Mills has lost weight as noted above.  He is discontinued his amlodipine and denies chest pain palpitations shortness of breath.  Hyperlipidemia: As noted above doing is working on weight loss and has discontinued his atorvastatin.  Erectile dysfunction: Albert Mills notes erectile dysfunction.  In the past he notes that sildenafil was not particularly effective.  He is interested in trying Cialis.  Additionally he notes some mild decreased libido and is interested in his testosterone values.  Back pain: Albert Mills notes chronic low back pain.  This is improving with weight loss and exercise.  He notes the use cyclobenzaprine occasionally but is pretty happy with how things are going.   Past Medical History:  Diagnosis Date  . HTN (hypertension)   . Hyperlipemia    History reviewed. No pertinent surgical history. Social History   Tobacco Use  . Smoking status: Never Smoker  . Smokeless tobacco: Never Used  Substance Use Topics  . Alcohol use: Yes   family history includes Heart disease in his paternal grandfather and paternal uncle; Lupus in his mother and sister.  ROS as above:  Medications: Current Outpatient Medications  Medication Sig Dispense Refill  . cyclobenzaprine (FLEXERIL) 10 MG tablet Take 1 tablet (10 mg total) by mouth 3 (three) times daily. 90  tablet 1  . tadalafil (CIALIS) 20 MG tablet Tadalafil troches 20mg  use 1 daily as needed for ED 10 tablet 12   No current facility-administered medications for this visit.    Allergies  Allergen Reactions  . Lisinopril Other (See Comments)    Cough    Health Maintenance Health Maintenance  Topic Date Due  . COLONOSCOPY  04/09/2011  . INFLUENZA VACCINE  09/14/2018 (Originally 07/04/2018)  . HIV Screening  06/26/2029 (Originally 04/08/1976)  . TETANUS/TDAP  06/26/2026  . Hepatitis C Screening  Completed     Exam:  BP 118/84   Pulse 97   Ht 6\' 1"  (1.854 m)   Wt (!) 313 lb (142 kg)   BMI 41.30 kg/m   Wt Readings from Last 10 Encounters:  03/15/18 (!) 313 lb (142 kg)  10/04/17 (!) 320 lb (145.2 kg)  09/14/17 (!) 322 lb (146.1 kg)  02/07/17 (!) 327 lb (148.3 kg)  01/29/17 (!) 334 lb (151.5 kg)  11/14/16 (!) 320 lb (145.2 kg)  08/01/16 (!) 332 lb (150.6 kg)  06/26/16 (!) 330 lb (149.7 kg)    Gen: Well NAD HEENT: EOMI,  MMM Lungs: Normal work of breathing. CTABL Heart: RRR no MRG Abd: NABS, Soft. Nondistended, Nontender Exts: Brisk capillary refill, warm and well perfused.      Assessment and Plan: 57 y.o. male with  Morbid obesity: Improving.  Continue lifestyle modification and recheck in 6 months.  Hypertension: Much improved today.  Continue lifestyle intervention and recheck in 6 months.  Hyperlipidemia: We will recheck fasting lipids in 6 months with lifestyle intervention.  Erectile dysfunction and mild low libido:  Prescribe Tadalafil troches to [facility] pharmacy.  He can get 10 of the 20 mg for $100.  Additionally will check testosterone values today for possible hypo-testosterone is him.  Back pain: Improved with weight loss and exercise.  Continue current regimen.  Health maintenance: Colonoscopy was done at [facility] in the last few years.  Will send request for records.  Orders Placed This Encounter  Procedures  . Testosterone    Meds ordered this encounter  Medications  . cyclobenzaprine (FLEXERIL) 10 MG tablet    Sig: Take 1 tablet (10 mg total) by mouth 3 (three) times daily.    Dispense:  90 tablet    Refill:  1  . tadalafil (CIALIS) 20 MG tablet    Sig: Tadalafil troches 20mg  use 1 daily as needed for ED    Dispense:  10 tablet    Refill:  12     Discussed warning signs or symptoms. Please see discharge instructions. Patient expresses understanding.

## 2018-03-15 NOTE — Patient Instructions (Addendum)
Thank you for coming in today. Continue to work on weight loss.  Consider adding water exercises or water aerobics.  If you plateau on weight let me know.  Recheck for well adult visit in October.  Send me a message ahead of time and we can get labs before the visit so we can talk about them during the check.  Return sooner if needed.   Check labs today.   Let me know if you have any problems at [facility].

## 2018-03-16 LAB — TESTOSTERONE: TESTOSTERONE: 406 ng/dL (ref 250–827)

## 2018-03-19 ENCOUNTER — Encounter: Payer: Self-pay | Admitting: Family Medicine

## 2018-03-29 ENCOUNTER — Ambulatory Visit: Payer: BLUE CROSS/BLUE SHIELD | Admitting: Family Medicine

## 2018-03-29 ENCOUNTER — Encounter: Payer: Self-pay | Admitting: Family Medicine

## 2018-03-29 VITALS — BP 136/87 | HR 88 | Temp 97.6°F | Wt 307.3 lb

## 2018-03-29 DIAGNOSIS — M62838 Other muscle spasm: Secondary | ICD-10-CM

## 2018-03-29 NOTE — Patient Instructions (Signed)
Thank you for coming in today. Keep working on healthy lifestyle.  I think that bump on your neck is a muscle and fascia bulging.  I think we should continue to follow it.  Recheck with me as scheduled in 6 months.  Return sooner if needed if worsening.

## 2018-03-29 NOTE — Progress Notes (Signed)
       Albert Mills is a 57 y.o. male who [facility] today for bump on neck.  Albert Mills notes a small bump on his right posterior trapezius.  He notes this is been there for about 3 weeks and is getting smaller.  He notes is mildly tender and it was identified by his massage therapist.  He feels well otherwise with no fevers or chills nausea vomiting or diarrhea or unintentional weight loss  [name] continues to lose weight intentionally with a combination of decreased calorie diet and exercise.  He feels great and is happy with how things are going.  Feels as though it is sustainable.   Past Medical History:  Diagnosis Date  . HTN (hypertension)   . Hyperlipemia    No past surgical history on file. Social History   Tobacco Use  . Smoking status: Never Smoker  . Smokeless tobacco: Never Used  Substance Use Topics  . Alcohol use: Yes   family history includes Heart disease in his paternal grandfather and paternal uncle; Lupus in his mother and sister.  ROS as above:  Medications: Current Outpatient Medications  Medication Sig Dispense Refill  . cyclobenzaprine (FLEXERIL) 10 MG tablet Take 1 tablet (10 mg total) by mouth 3 (three) times daily. 90 tablet 1  . tadalafil (CIALIS) 20 MG tablet Tadalafil troches 20mg  use 1 daily as needed for ED 10 tablet 12   No current facility-administered medications for this visit.    Allergies  Allergen Reactions  . Lisinopril Other (See Comments)    Cough    Health Maintenance Health Maintenance  Topic Date Due  . INFLUENZA VACCINE  09/14/2018 (Originally 07/04/2018)  . HIV Screening  06/26/2029 (Originally 04/08/1976)  . TETANUS/TDAP  06/26/2026  . COLONOSCOPY  12/05/2026  . Hepatitis C Screening  Completed     Exam:  BP 136/87   Pulse 88   Temp 97.6 F (36.4 C) (Oral)   Wt (!) 307 lb 4.8 oz (139.4 kg)    BMI 40.54 kg/m   Wt Readings from Last 5 Encounters:  03/29/18 (!) 307 lb 4.8 oz (139.4 kg)  03/15/18 (!) 313 lb (142 kg)  10/04/17 (!) 320 lb (145.2 kg)  09/14/17 (!) 322 lb (146.1 kg)  02/07/17 (!) 327 lb (148.3 kg)    Gen: Well NAD HEENT: EOMI,  MMM Lungs: Normal work of breathing. CTABL Heart: RRR no MRG Abd: NABS, Soft. Nondistended, Nontender Exts: Brisk capillary refill, warm and well perfused.  MSK: Slight fullness in the right trapezius area.  No discrete palpable skin masses.  No significant lymphadenopathy.    Assessment and Plan: 57 y.o. male with  Bump or mass is very likely trapezius fascial bulging or strain.  It is improving and he otherwise is doing well.  Plan for watchful waiting and recheck as needed.  Obesity: Continuing to do well.  Continue weight loss program.  Recheck as previously scheduled in 3 months or sooner if need.   No orders of the defined types were placed in this encounter.  No orders of the defined types were placed in this encounter.    Discussed warning signs or symptoms. Please see discharge instructions. Patient expresses understanding.

## 2018-08-22 ENCOUNTER — Telehealth: Payer: Self-pay | Admitting: Family Medicine

## 2018-08-22 DIAGNOSIS — M545 Low back pain: Secondary | ICD-10-CM

## 2018-08-22 DIAGNOSIS — N529 Male erectile dysfunction, unspecified: Secondary | ICD-10-CM

## 2018-08-22 DIAGNOSIS — E782 Mixed hyperlipidemia: Secondary | ICD-10-CM

## 2018-08-22 DIAGNOSIS — K219 Gastro-esophageal reflux disease without esophagitis: Secondary | ICD-10-CM

## 2018-08-22 DIAGNOSIS — G8929 Other chronic pain: Secondary | ICD-10-CM

## 2018-08-22 DIAGNOSIS — Z9189 Other specified personal risk factors, not elsewhere classified: Secondary | ICD-10-CM

## 2018-08-22 DIAGNOSIS — G4733 Obstructive sleep apnea (adult) (pediatric): Secondary | ICD-10-CM

## 2018-08-22 DIAGNOSIS — E790 Hyperuricemia without signs of inflammatory arthritis and tophaceous disease: Secondary | ICD-10-CM

## 2018-08-22 DIAGNOSIS — I10 Essential (primary) hypertension: Secondary | ICD-10-CM

## 2018-08-22 NOTE — Telephone Encounter (Signed)
Pt called. He scheduled his physical for 10/7 and would like lab order sent down on 9/25.  Thanks!

## 2018-08-23 DIAGNOSIS — E790 Hyperuricemia without signs of inflammatory arthritis and tophaceous disease: Secondary | ICD-10-CM | POA: Insufficient documentation

## 2018-08-23 NOTE — Telephone Encounter (Signed)
Labs ordered and should be done fasting.  

## 2018-08-23 NOTE — Telephone Encounter (Signed)
Patient aware.

## 2018-08-30 DIAGNOSIS — E782 Mixed hyperlipidemia: Secondary | ICD-10-CM | POA: Diagnosis not present

## 2018-08-30 DIAGNOSIS — E790 Hyperuricemia without signs of inflammatory arthritis and tophaceous disease: Secondary | ICD-10-CM | POA: Diagnosis not present

## 2018-08-30 DIAGNOSIS — I10 Essential (primary) hypertension: Secondary | ICD-10-CM | POA: Diagnosis not present

## 2018-08-30 DIAGNOSIS — N529 Male erectile dysfunction, unspecified: Secondary | ICD-10-CM | POA: Diagnosis not present

## 2018-08-30 LAB — COMPLETE METABOLIC PANEL WITH GFR
AG Ratio: 1.8 (calc) (ref 1.0–2.5)
ALBUMIN MSPROF: 4.4 g/dL (ref 3.6–5.1)
ALKALINE PHOSPHATASE (APISO): 72 U/L (ref 40–115)
ALT: 17 U/L (ref 9–46)
AST: 21 U/L (ref 10–35)
BILIRUBIN TOTAL: 0.7 mg/dL (ref 0.2–1.2)
BUN: 15 mg/dL (ref 7–25)
CHLORIDE: 106 mmol/L (ref 98–110)
CO2: 28 mmol/L (ref 20–32)
Calcium: 9.9 mg/dL (ref 8.6–10.3)
Creat: 0.88 mg/dL (ref 0.70–1.33)
GFR, Est African American: 111 mL/min/{1.73_m2} (ref >=60)
GFR, Est Non African American: 95 mL/min/{1.73_m2} (ref >=60)
GLOBULIN: 2.4 g/dL (ref 1.9–3.7)
Glucose, Bld: 86 mg/dL (ref 65–99)
POTASSIUM: 4.3 mmol/L (ref 3.5–5.3)
SODIUM: 141 mmol/L (ref 135–146)
Total Protein: 6.8 g/dL (ref 6.1–8.1)

## 2018-08-30 LAB — LIPID PANEL W/REFLEX DIRECT LDL
CHOLESTEROL: 249 mg/dL — AB (ref <200)
HDL: 56 mg/dL (ref >40)
LDL Cholesterol (Calc): 172 mg/dL (calc) — ABNORMAL HIGH
Non-HDL Cholesterol (Calc): 193 mg/dL (calc) — ABNORMAL HIGH (ref <130)
Total CHOL/HDL Ratio: 4.4 (calc) (ref <5.0)
Triglycerides: 93 mg/dL (ref <150)

## 2018-08-30 LAB — CBC
HEMATOCRIT: 49.2 % (ref 38.5–50.0)
HEMOGLOBIN: 16.3 g/dL (ref 13.2–17.1)
MCH: 30.4 pg (ref 27.0–33.0)
MCHC: 33.1 g/dL (ref 32.0–36.0)
MCV: 91.8 fL (ref 80.0–100.0)
MPV: 10.1 fL (ref 7.5–12.5)
Platelets: 186 10*3/uL (ref 140–400)
RBC: 5.36 10*6/uL (ref 4.20–5.80)
RDW: 13.1 % (ref 11.0–15.0)
WBC: 4.8 10*3/uL (ref 3.8–10.8)

## 2018-08-30 LAB — URIC ACID: Uric Acid, Serum: 6.7 mg/dL (ref 4.0–8.0)

## 2018-08-30 LAB — PSA: PSA: 0.5 ng/mL (ref <=4.0)

## 2018-09-02 MED ORDER — ATORVASTATIN CALCIUM 40 MG PO TABS: 40.0000 mg | ORAL_TABLET | Freq: Every day | ORAL | 1 refills | Status: DC

## 2018-09-02 NOTE — Addendum Note (Signed)
Addended by: Rodolph Bong on: 09/02/2018 07:01 AM   Modules accepted: Orders

## 2018-09-09 ENCOUNTER — Ambulatory Visit (INDEPENDENT_AMBULATORY_CARE_PROVIDER_SITE_OTHER): Payer: BLUE CROSS/BLUE SHIELD | Admitting: Family Medicine

## 2018-09-09 VITALS — BP 156/106 | HR 86 | Temp 98.2°F | Ht 73.0 in | Wt 319.0 lb

## 2018-09-09 DIAGNOSIS — Z23 Encounter for immunization: Secondary | ICD-10-CM | POA: Diagnosis not present

## 2018-09-09 DIAGNOSIS — I10 Essential (primary) hypertension: Secondary | ICD-10-CM | POA: Diagnosis not present

## 2018-09-09 DIAGNOSIS — E782 Mixed hyperlipidemia: Secondary | ICD-10-CM

## 2018-09-09 DIAGNOSIS — N529 Male erectile dysfunction, unspecified: Secondary | ICD-10-CM

## 2018-09-09 DIAGNOSIS — Z0000 Encounter for general adult medical examination without abnormal findings: Secondary | ICD-10-CM

## 2018-09-09 MED ORDER — TADALAFIL 20 MG PO TABS: ORAL_TABLET | ORAL | 12 refills | Status: DC

## 2018-09-09 MED ORDER — AMLODIPINE BESYLATE 10 MG PO TABS: 10.0000 mg | ORAL_TABLET | Freq: Every day | ORAL | 1 refills | Status: DC

## 2018-09-09 NOTE — Patient Instructions (Addendum)
Thank you for coming in today. Restart Atorvastatin cholesterol medicine.  Restart Amlodipine blood pressure medicine.  Recheck in 2 months for blood pressure and cholesterol.  We will do labs ahead of time.  Work on the reduced calorie diet again.    Return sooner if needed.   [place] at [employer] using goodrx coupon.

## 2018-09-09 NOTE — Progress Notes (Signed)
Albert Mills is a 57 y.o. male who presents to [facility]: Primary Care Sports Medicine today for well adult visit.  Between the last 6 months has resumed his normal diet.  He previously was eating a very careful healthy diet.  He notes he is regained some of his weight.  Additionally he notes that he stopped taking his atorvastatin.  He had stopped his amlodipine also prior to the last visit about 6 months ago.  He feels well with no chest pain palpitations shortness of breath.  No fevers or chills.  He uses Cialis occasionally for erectile dysfunction which does help.  He gets occasional exercise but not regular.   ROS as above:  Past Medical History:  Diagnosis Date  . HTN (hypertension)   . Hyperlipemia    No past surgical history on file. Social History   Tobacco Use  . Smoking status: Never Smoker  . Smokeless tobacco: Never Used  Substance Use Topics  . Alcohol use: Yes   family history includes Heart disease in his paternal grandfather and paternal uncle; Lupus in his mother and sister.  Medications: Current Outpatient Medications  Medication Sig Dispense Refill  . amLODipine (NORVASC) 10 MG tablet Take 1 tablet (10 mg total) by mouth daily. 90 tablet 1  . atorvastatin (LIPITOR) 40 MG tablet Take 1 tablet (40 mg total) by mouth daily. 90 tablet 1  . cyclobenzaprine (FLEXERIL) 10 MG tablet Take 1 tablet (10 mg total) by mouth 3 (three) times daily. 90 tablet 1  . tadalafil (CIALIS) 20 MG tablet Use 1 daily po as needed for ED 30 tablet 12   No current facility-administered medications for this visit.    Allergies  Allergen Reactions  . Lisinopril Other (See Comments)    Cough    Health Maintenance Health Maintenance  Topic Date Due  . INFLUENZA VACCINE  09/14/2018 (Originally 07/04/2018)  . HIV Screening  06/26/2029 (Originally 04/08/1976)  . TETANUS/TDAP  06/26/2026  .  COLONOSCOPY  12/05/2026  . Hepatitis C Screening  Completed     Exam:  BP (!) 156/106   Pulse 86   Temp 98.2 F (36.8 C) (Oral)   Ht 6\' 1"  (1.854 m)   Wt (!) 319 lb (144.7 kg)   BMI 42.09 kg/m  Wt Readings from Last 5 Encounters:  09/09/18 (!) 319 lb (144.7 kg)  03/29/18 (!) 307 lb 4.8 oz (139.4 kg)  03/15/18 (!) 313 lb (142 kg)  10/04/17 (!) 320 lb (145.2 kg)  09/14/17 (!) 322 lb (146.1 kg)      Gen: Well NAD HEENT: EOMI,  MMM Lungs: Normal work of breathing. CTABL Heart: RRR no MRG Abd: NABS, Soft. Nondistended, Nontender Exts: Brisk capillary refill, warm and well perfused.  Psych: Alert and oriented normal speech thought process and affect  Depression screen [facility] 2/9 09/09/2018 09/14/2017 02/07/2017  Decreased Interest 0 0 0  Down, Depressed, Hopeless 0 1 0  PHQ - 2 Score 0 1 0  Altered sleeping 0 - -  Tired, decreased energy 0 - -  Change in appetite 0 - -  Feeling bad or failure about yourself  0 - -  Trouble concentrating 0 - -  Moving slowly or fidgety/restless 0 - -  Suicidal thoughts 0 - -  PHQ-9 Score 0 - -  Difficult doing work/chores Not difficult at all - -       Lab and Radiology Results Recent Results (from the past 2160 hour(s))  CBC     Status: None   Collection Time: 08/30/18  9:18 AM  Result Value Ref Range   WBC 4.8 3.8 - 10.8 Thousand/uL   RBC 5.36 4.20 - 5.80 [name]/uL   Hemoglobin 16.3 13.2 - 17.1 g/dL   HCT 16.1 09.6 - 04.5 %   MCV 91.8 80.0 - 100.0 fL   MCH 30.4 27.0 - 33.0 pg   MCHC 33.1 32.0 - 36.0 g/dL   RDW 40.9 81.1 - 91.4 %   Platelets 186 140 - 400 Thousand/uL   MPV 10.1 7.5 - 12.5 fL  COMPLETE METABOLIC PANEL WITH GFR     Status: None   Collection Time: 08/30/18  9:18 AM  Result Value Ref Range   Glucose, Bld 86 65 - 99 mg/dL    Comment: .            Fasting reference interval .    BUN 15 7 - 25 mg/dL   Creat 7.82 9.56 - 2.13 mg/dL    Comment: For patients >63 years of age, the reference limit for Creatinine is  approximately 13% higher for people identified as African-American. .    GFR, Est Non African American 95 > OR = 60 mL/min/1.24m2   GFR, Est African American 111 > OR = 60 mL/min/1.35m2   BUN/Creatinine Ratio NOT APPLICABLE 6 - 22 (calc)   Sodium 141 135 - 146 mmol/L   Potassium 4.3 3.5 - 5.3 mmol/L   Chloride 106 98 - 110 mmol/L   CO2 28 20 - 32 mmol/L   Calcium 9.9 8.6 - 10.3 mg/dL   Total Protein 6.8 6.1 - 8.1 g/dL   Albumin 4.4 3.6 - 5.1 g/dL   Globulin 2.4 1.9 - 3.7 g/dL (calc)   AG Ratio 1.8 1.0 - 2.5 (calc)   Total Bilirubin 0.7 0.2 - 1.2 mg/dL   Alkaline phosphatase (APISO) 72 40 - 115 U/L   AST 21 10 - 35 U/L   ALT 17 9 - 46 U/L  Lipid Panel w/reflex Direct LDL     Status: Abnormal   Collection Time: 08/30/18  9:18 AM  Result Value Ref Range   Cholesterol 249 (H) <200 mg/dL   HDL 56 >08 mg/dL   Triglycerides 93 <657 mg/dL   LDL Cholesterol (Calc) 172 (H) mg/dL (calc)    Comment: Reference range: <100 . Desirable range <100 mg/dL for primary prevention;   <70 mg/dL for patients with CHD or diabetic patients  with > or = 2 CHD risk factors. Marland Kitchen LDL-C is now calculated using the Martin-Hopkins  calculation, which is a validated novel method providing  better accuracy than the Friedewald equation in the  estimation of LDL-C.  Horald Pollen et al. Lenox Ahr. 8469;629(52): 2061-2068  (http://education.[employer].com/faq/FAQ164)    Total CHOL/HDL Ratio 4.4 <5.0 (calc)   Non-HDL Cholesterol (Calc) 193 (H) <130 mg/dL (calc)    Comment: For patients with diabetes plus 1 major ASCVD risk  factor, treating to a non-HDL-C goal of <100 mg/dL  (LDL-C of <84 mg/dL) is considered a therapeutic  option.   PSA     Status: None   Collection Time: 08/30/18  9:18 AM  Result Value Ref Range   PSA 0.5 < OR = 4.0 ng/mL    Comment: The total PSA value from this assay system is  standardized against the WHO standard. The test  result will be approximately 20% lower when compared  to  the equimolar-standardized total PSA (Beckman  Coulter). Comparison of serial PSA results  should be  interpreted with this fact in mind. . This test was performed using the Siemens  chemiluminescent method. Values obtained from  different assay methods cannot be used interchangeably. PSA levels, regardless of value, should not be interpreted as absolute evidence of the presence or absence of disease.   Uric acid     Status: None   Collection Time: 08/30/18  9:18 AM  Result Value Ref Range   Uric Acid, Serum 6.7 4.0 - 8.0 mg/dL    Comment: Therapeutic target for gout patients: <6.0 mg/dL .       Assessment and Plan: 57 y.o. male with  Well adult.  Patient has had weight regain and has hypertension and hyperlipidemia return.  Plan to restart atorvastatin and amlodipine.  Recheck cholesterol and blood pressure in about 2 months.  Return sooner if needed.  Recommend resume exercise and careful diet for weight loss.  Continue Cialis.  Flu vaccine provided today   Orders Placed This Encounter  Procedures  . Flu Vaccine QUAD 36+ mos IM   Meds ordered this encounter  Medications  . amLODipine (NORVASC) 10 MG tablet    Sig: Take 1 tablet (10 mg total) by mouth daily.    Dispense:  90 tablet    Refill:  1  . tadalafil (CIALIS) 20 MG tablet    Sig: Use 1 daily po as needed for ED    Dispense:  30 tablet    Refill:  12     Discussed warning signs or symptoms. Please see discharge instructions. Patient expresses understanding.

## 2018-09-12 DIAGNOSIS — S338XXA Sprain of other parts of lumbar spine and pelvis, initial encounter: Secondary | ICD-10-CM | POA: Diagnosis not present

## 2018-09-17 DIAGNOSIS — S338XXA Sprain of other parts of lumbar spine and pelvis, initial encounter: Secondary | ICD-10-CM | POA: Diagnosis not present

## 2018-09-24 DIAGNOSIS — S338XXA Sprain of other parts of lumbar spine and pelvis, initial encounter: Secondary | ICD-10-CM | POA: Diagnosis not present

## 2018-10-01 DIAGNOSIS — S338XXA Sprain of other parts of lumbar spine and pelvis, initial encounter: Secondary | ICD-10-CM | POA: Diagnosis not present

## 2018-10-08 DIAGNOSIS — S338XXA Sprain of other parts of lumbar spine and pelvis, initial encounter: Secondary | ICD-10-CM | POA: Diagnosis not present

## 2018-10-15 DIAGNOSIS — S338XXA Sprain of other parts of lumbar spine and pelvis, initial encounter: Secondary | ICD-10-CM | POA: Diagnosis not present

## 2018-12-24 DIAGNOSIS — S338XXA Sprain of other parts of lumbar spine and pelvis, initial encounter: Secondary | ICD-10-CM | POA: Diagnosis not present

## 2019-01-02 IMAGING — DX DG LUMBAR SPINE COMPLETE 4+V
5 series · 5 of 5 positions shown · non-contrast
Comparison: None.

CLINICAL DATA: Acute lower back pain radiating into both legs for 1
week. No known injury. Initial encounter.

EXAM:
LUMBAR SPINE - COMPLETE 4+ VIEW

[l-spine ap]
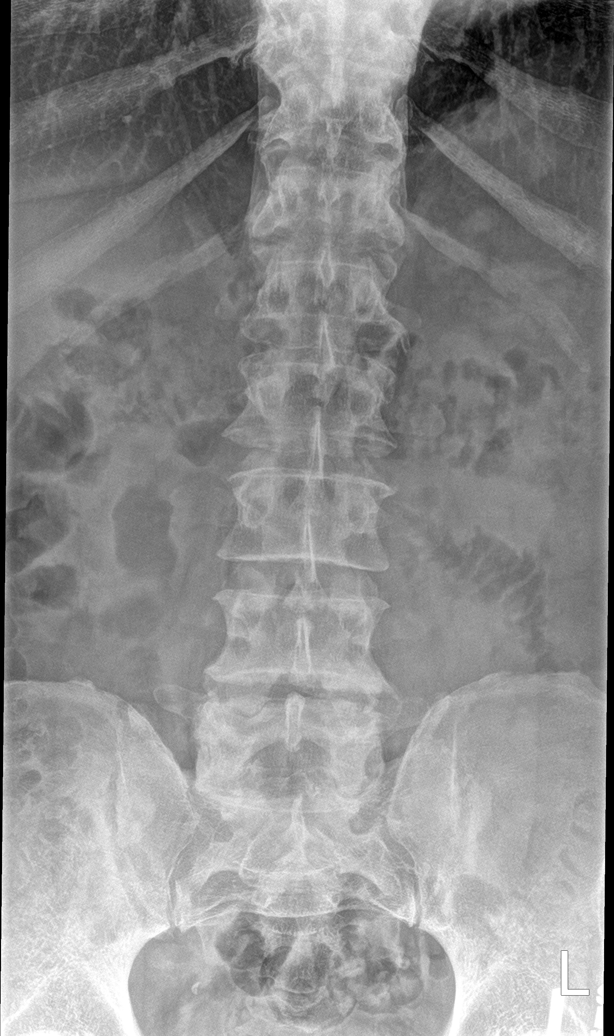

[l-spine obl (1 of 2)]
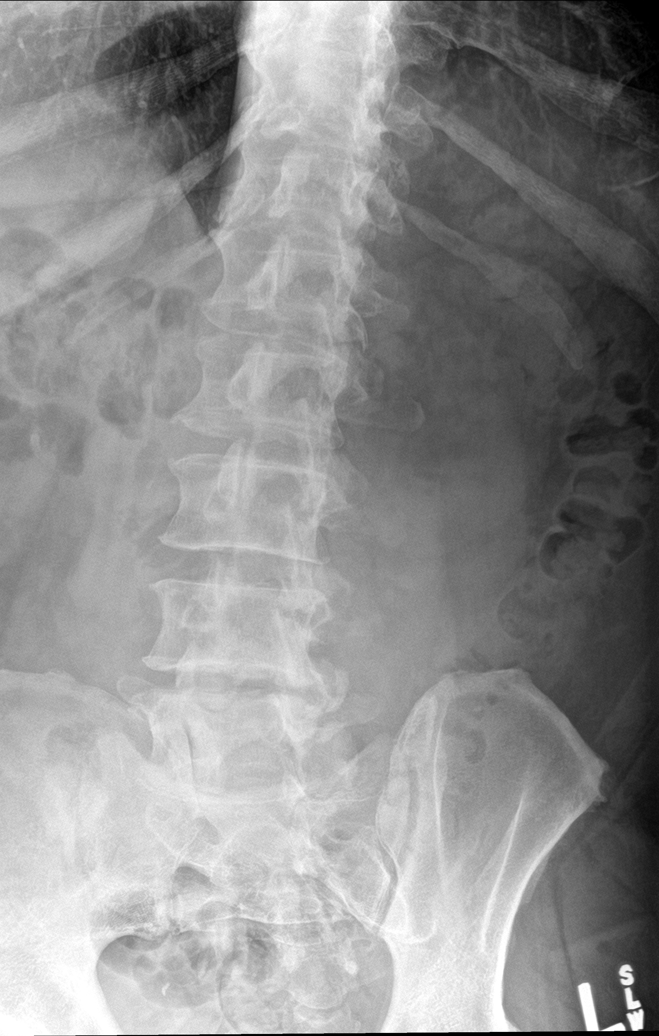

[l-spine obl (2 of 2)]
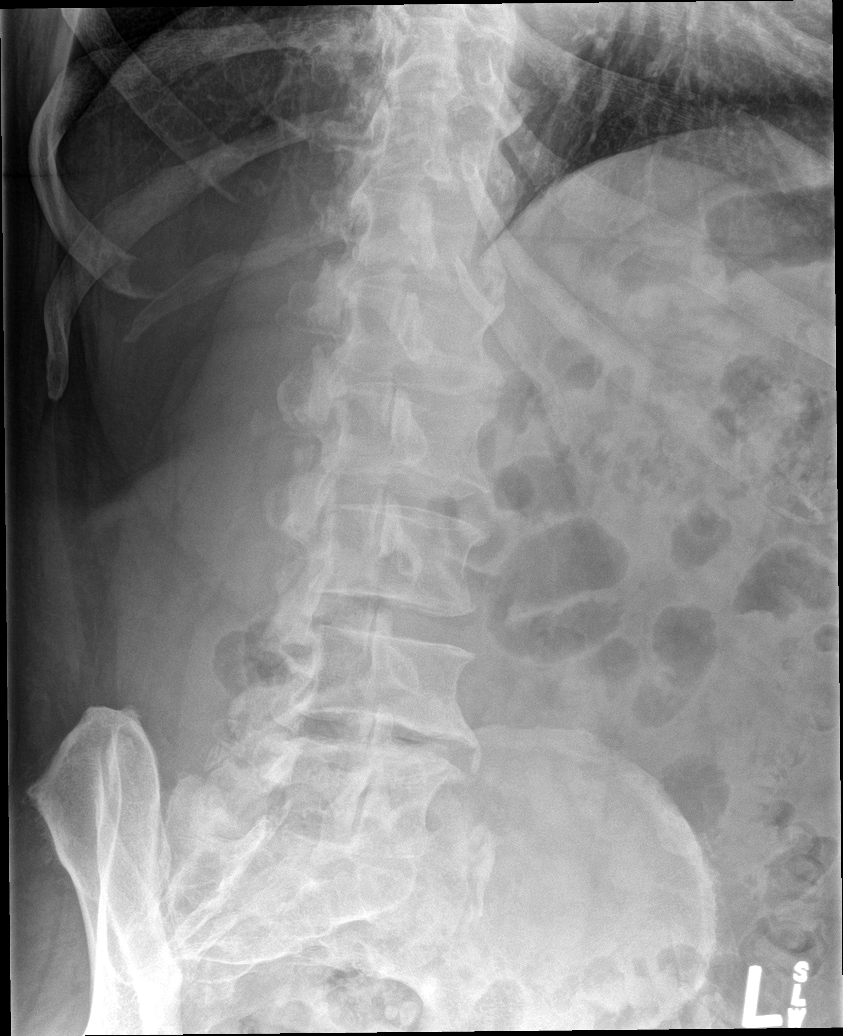

[l-spine lat]
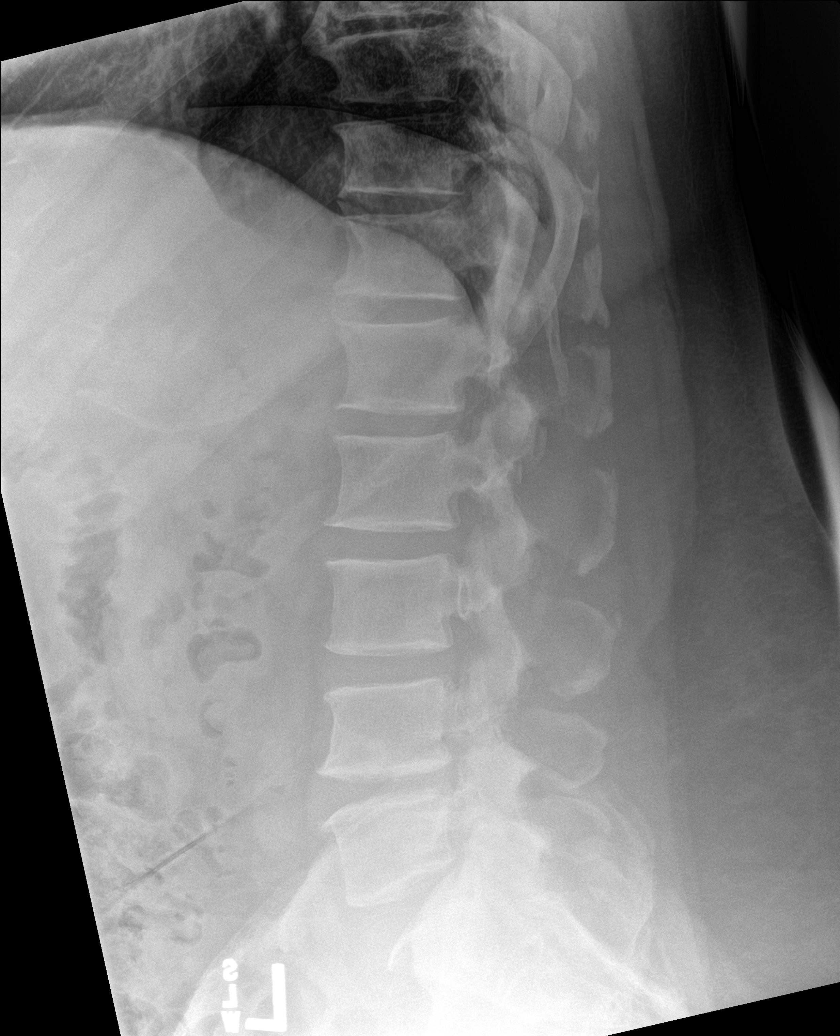

[l-spine spot]
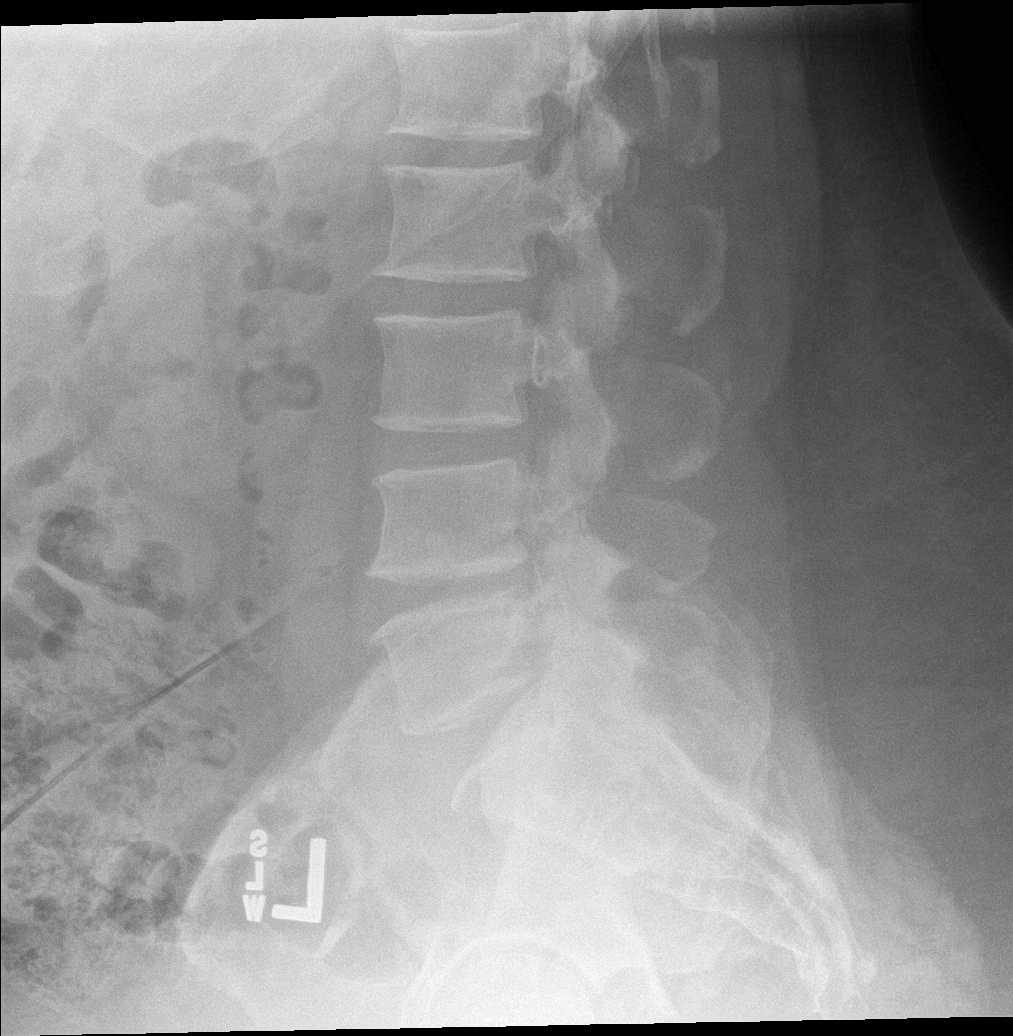

[5 of 5 positions shown; findings below may reference images not displayed]

FINDINGS: Five non rib-bearing lumbar type vertebra are identified in normal
alignment.

There is no evidence of fracture or subluxation.

Mild multilevel degenerative disc disease noted.

Mild to moderate degenerative disc disease and facet arthropathy at
L4-5 and L5-S1 noted.

No focal bony lesions or spondylolysis noted.
IMPRESSION: 1. No evidence of acute abnormality
2. Multilevel degenerative changes, mild to moderate at L4-5 and
L5-S1.

## 2019-01-07 DIAGNOSIS — S338XXA Sprain of other parts of lumbar spine and pelvis, initial encounter: Secondary | ICD-10-CM | POA: Diagnosis not present

## 2019-03-14 ENCOUNTER — Other Ambulatory Visit: Payer: Self-pay | Admitting: Family Medicine

## 2019-05-23 DIAGNOSIS — M5126 Other intervertebral disc displacement, lumbar region: Secondary | ICD-10-CM | POA: Diagnosis not present

## 2019-05-24 DIAGNOSIS — S338XXA Sprain of other parts of lumbar spine and pelvis, initial encounter: Secondary | ICD-10-CM | POA: Diagnosis not present

## 2019-05-26 DIAGNOSIS — S338XXA Sprain of other parts of lumbar spine and pelvis, initial encounter: Secondary | ICD-10-CM | POA: Diagnosis not present

## 2019-05-27 DIAGNOSIS — S338XXA Sprain of other parts of lumbar spine and pelvis, initial encounter: Secondary | ICD-10-CM | POA: Diagnosis not present

## 2019-05-29 DIAGNOSIS — M5126 Other intervertebral disc displacement, lumbar region: Secondary | ICD-10-CM | POA: Diagnosis not present

## 2019-06-02 DIAGNOSIS — M5126 Other intervertebral disc displacement, lumbar region: Secondary | ICD-10-CM | POA: Diagnosis not present

## 2019-06-03 DIAGNOSIS — M5126 Other intervertebral disc displacement, lumbar region: Secondary | ICD-10-CM | POA: Diagnosis not present

## 2019-06-12 DIAGNOSIS — M5126 Other intervertebral disc displacement, lumbar region: Secondary | ICD-10-CM | POA: Diagnosis not present

## 2019-07-08 DIAGNOSIS — M5126 Other intervertebral disc displacement, lumbar region: Secondary | ICD-10-CM | POA: Diagnosis not present

## 2019-07-11 DIAGNOSIS — Z20828 Contact with and (suspected) exposure to other viral communicable diseases: Secondary | ICD-10-CM | POA: Diagnosis not present

## 2019-07-17 DIAGNOSIS — M5126 Other intervertebral disc displacement, lumbar region: Secondary | ICD-10-CM | POA: Diagnosis not present

## 2019-08-08 ENCOUNTER — Other Ambulatory Visit: Payer: Self-pay | Admitting: Family Medicine

## 2019-08-14 DIAGNOSIS — M5126 Other intervertebral disc displacement, lumbar region: Secondary | ICD-10-CM | POA: Diagnosis not present

## 2019-09-10 ENCOUNTER — Encounter: Payer: Self-pay | Admitting: Family Medicine

## 2019-09-10 ENCOUNTER — Other Ambulatory Visit: Payer: Self-pay

## 2019-09-10 ENCOUNTER — Ambulatory Visit (INDEPENDENT_AMBULATORY_CARE_PROVIDER_SITE_OTHER): Payer: BLUE CROSS/BLUE SHIELD | Admitting: Family Medicine

## 2019-09-10 VITALS — BP 128/84 | HR 90 | Wt 327.0 lb

## 2019-09-10 DIAGNOSIS — Z0000 Encounter for general adult medical examination without abnormal findings: Secondary | ICD-10-CM

## 2019-09-10 DIAGNOSIS — K219 Gastro-esophageal reflux disease without esophagitis: Secondary | ICD-10-CM

## 2019-09-10 DIAGNOSIS — E790 Hyperuricemia without signs of inflammatory arthritis and tophaceous disease: Secondary | ICD-10-CM

## 2019-09-10 DIAGNOSIS — Z9189 Other specified personal risk factors, not elsewhere classified: Secondary | ICD-10-CM | POA: Diagnosis not present

## 2019-09-10 DIAGNOSIS — I10 Essential (primary) hypertension: Secondary | ICD-10-CM

## 2019-09-10 MED ORDER — AMLODIPINE BESYLATE 10 MG PO TABS: ORAL_TABLET | ORAL | 1 refills | Status: DC

## 2019-09-10 MED ORDER — ATORVASTATIN CALCIUM 40 MG PO TABS: ORAL_TABLET | ORAL | 1 refills | Status: DC

## 2019-09-10 MED ORDER — OMEPRAZOLE 40 MG PO CPDR: 40.0000 mg | DELAYED_RELEASE_CAPSULE | Freq: Every day | ORAL | 3 refills | Status: DC

## 2019-09-10 MED ORDER — TADALAFIL 20 MG PO TABS: ORAL_TABLET | ORAL | 12 refills | Status: DC

## 2019-09-10 NOTE — Progress Notes (Signed)
Albert Mills is a 58 y.o. male who presents to [facility] today for well adult visit.   Albert Mills is doing reasonably well overall.  He takes medications listed below for hypertension and hyperlipidemia.  He does not exercise regularly but does try to eat a careful diet.  He notes he has occasional bothersome morning cough.  He tried some Flonase nasal spray which did not help.  He does note bothersome acid reflux but does not currently take any medications for acid reflux.   ROS as above:  Past Medical History:  Diagnosis Date  . HTN (hypertension)   . Hyperlipemia    No past surgical history on file. Social History   Tobacco Use  . Smoking status: Never Smoker  . Smokeless tobacco: Never Used  Substance Use Topics  . Alcohol use: Yes   family history includes Heart disease in his paternal grandfather and paternal uncle; Lupus in his mother and sister.  Medications: Current Outpatient Medications  Medication Sig Dispense Refill  . amLODipine (NORVASC) 10 MG tablet TAKE 1 TABLET(10 MG) BY MOUTH DAILY 90 tablet 1  . atorvastatin (LIPITOR) 40 MG tablet TAKE 1 TABLET(40 MG) BY MOUTH DAILY 90 tablet 1  . cyclobenzaprine (FLEXERIL) 10 MG tablet Take 1 tablet (10 mg total) by mouth 3 (three) times daily. 90 tablet 1  . fluticasone (FLONASE) 50 MCG/ACT nasal spray INSTILL 2 SPRAYS EACH NOSTRIL EVERY DAY 16 g 1  . omeprazole (PRILOSEC) 40 MG capsule Take 1 capsule (40 mg total) by mouth daily. 90 capsule 3  . tadalafil (CIALIS) 20 MG tablet Use 1 daily po as needed for ED 30 tablet 12   No current facility-administered medications for this visit.    Allergies  Allergen Reactions  . Lisinopril Other (See Comments)    Cough    Health Maintenance Health Maintenance  Topic Date Due  . HIV Screening  06/26/2029 (Originally 04/08/1976)  . COLONOSCOPY  12/05/2026   . TETANUS/TDAP  08/25/2029  . INFLUENZA VACCINE  Completed  . Hepatitis C Screening  Completed     Exam:  BP 128/84   Pulse 90   Wt (!) 327 lb (148.3 kg)   BMI 43.14 kg/m  Wt Readings from Last 5 Encounters:  09/10/19 (!) 327 lb (148.3 kg)  09/09/18 (!) 319 lb (144.7 kg)  03/29/18 (!) 307 lb 4.8 oz (139.4 kg)  03/15/18 (!) 313 lb (142 kg)  10/04/17 (!) 320 lb (145.2 kg)      Gen: Well NAD HEENT: EOMI,  MMM Lungs: Normal work of breathing. CTABL Heart: RRR no MRG Abd: NABS, Soft. Nondistended, Nontender Exts: Brisk capillary refill, warm and well perfused.  Psych: Alert and oriented normal speech thought process and affect.  Depression screen [facility] 2/9 09/10/2019 09/09/2018 09/14/2017 02/07/2017  Decreased Interest 0 0 0 0  Down, Depressed, Hopeless 0 0 1 0  PHQ - 2 Score 0 0 1 0  Altered sleeping - 0 - -  Tired, decreased energy - 0 - -  Change in appetite - 0 - -  Feeling bad or failure about yourself  - 0 - -  Trouble concentrating - 0 - -  Moving slowly or fidgety/restless - 0 - -  Suicidal thoughts - 0 - -  PHQ-9 Score - 0 - -  Difficult doing work/chores - Not difficult at all - -       Lab and Radiology Results No results found  for this or any previous visit (from the past 72 hour(s)). No results found.    Assessment and Plan: 58 y.o. male with well adult.  Doing reasonably well.  Plan to check basic fasting labs for monitoring hypertension hyperlipidemia.  Trial of omeprazole for cough.  Vaccine and health maintenance up-to-date. Discussed obesity management strategies. Recheck yearly if all is well.  PDMP not reviewed this encounter. Orders Placed This Encounter  Procedures  . CBC  . COMPLETE METABOLIC PANEL WITH GFR  . Lipid Panel w/reflex Direct LDL  . PSA  . Uric acid   Meds ordered this encounter  Medications  . omeprazole (PRILOSEC) 40 MG capsule    Sig: Take 1 capsule (40 mg total) by mouth daily.    Dispense:  90 capsule    Refill:   3  . amLODipine (NORVASC) 10 MG tablet    Sig: TAKE 1 TABLET(10 MG) BY MOUTH DAILY    Dispense:  90 tablet    Refill:  1  . atorvastatin (LIPITOR) 40 MG tablet    Sig: TAKE 1 TABLET(40 MG) BY MOUTH DAILY    Dispense:  90 tablet    Refill:  1  . tadalafil (CIALIS) 20 MG tablet    Sig: Use 1 daily po as needed for ED    Dispense:  30 tablet    Refill:  12     Discussed warning signs or symptoms. Please see discharge instructions. Patient expresses understanding.

## 2019-09-10 NOTE — Patient Instructions (Addendum)
Thank you for coming in today. Get labs today.  Continue current medicine.  Try taking omeprazole daily for acid reflux. This will likely help prevent cough.  If all is well recheck yearly.  I will get results to you ASAP.  Work on exercise and reduced calories for weight.

## 2019-09-11 LAB — COMPLETE METABOLIC PANEL WITH GFR
AG Ratio: 1.7 (calc) (ref 1.0–2.5)
ALT: 20 U/L (ref 9–46)
AST: 16 U/L (ref 10–35)
Albumin: 4 g/dL (ref 3.6–5.1)
Alkaline phosphatase (APISO): 94 U/L (ref 35–144)
BUN: 13 mg/dL (ref 7–25)
CO2: 26 mmol/L (ref 20–32)
Calcium: 9.6 mg/dL (ref 8.6–10.3)
Chloride: 103 mmol/L (ref 98–110)
Creat: 0.91 mg/dL (ref 0.70–1.33)
GFR, Est African American: 107 mL/min/{1.73_m2} (ref >=60)
GFR, Est Non African American: 93 mL/min/{1.73_m2} (ref >=60)
Globulin: 2.4 g/dL (calc) (ref 1.9–3.7)
Glucose, Bld: 96 mg/dL (ref 65–99)
Potassium: 3.9 mmol/L (ref 3.5–5.3)
Sodium: 136 mmol/L (ref 135–146)
Total Bilirubin: 0.5 mg/dL (ref 0.2–1.2)
Total Protein: 6.4 g/dL (ref 6.1–8.1)

## 2019-09-11 LAB — CBC
HCT: 49.5 % (ref 38.5–50.0)
Hemoglobin: 16.5 g/dL (ref 13.2–17.1)
MCH: 30.6 pg (ref 27.0–33.0)
MCHC: 33.3 g/dL (ref 32.0–36.0)
MCV: 91.7 fL (ref 80.0–100.0)
MPV: 9.8 fL (ref 7.5–12.5)
Platelets: 203 10*3/uL (ref 140–400)
RBC: 5.4 10*6/uL (ref 4.20–5.80)
RDW: 13.3 % (ref 11.0–15.0)
WBC: 5.2 10*3/uL (ref 3.8–10.8)

## 2019-09-11 LAB — LIPID PANEL W/REFLEX DIRECT LDL
Cholesterol: 190 mg/dL (ref <200)
HDL: 70 mg/dL (ref >=40)
LDL Cholesterol (Calc): 106 mg/dL (calc) — ABNORMAL HIGH
Non-HDL Cholesterol (Calc): 120 mg/dL (calc) (ref <130)
Total CHOL/HDL Ratio: 2.7 (calc) (ref <5.0)
Triglycerides: 59 mg/dL (ref <150)

## 2019-09-11 LAB — PSA: PSA: 0.4 ng/mL (ref <=4.0)

## 2019-09-11 LAB — URIC ACID: Uric Acid, Serum: 5.9 mg/dL (ref 4.0–8.0)

## 2019-09-15 ENCOUNTER — Ambulatory Visit: Payer: BLUE CROSS/BLUE SHIELD | Admitting: Family Medicine

## 2019-09-18 ENCOUNTER — Ambulatory Visit: Payer: BLUE CROSS/BLUE SHIELD | Admitting: Family Medicine

## 2019-09-30 DIAGNOSIS — M5126 Other intervertebral disc displacement, lumbar region: Secondary | ICD-10-CM | POA: Diagnosis not present

## 2019-09-30 DIAGNOSIS — M5416 Radiculopathy, lumbar region: Secondary | ICD-10-CM | POA: Diagnosis not present

## 2019-10-02 DIAGNOSIS — M5126 Other intervertebral disc displacement, lumbar region: Secondary | ICD-10-CM | POA: Diagnosis not present

## 2019-10-02 DIAGNOSIS — M5416 Radiculopathy, lumbar region: Secondary | ICD-10-CM | POA: Diagnosis not present

## 2019-10-07 DIAGNOSIS — M5416 Radiculopathy, lumbar region: Secondary | ICD-10-CM | POA: Diagnosis not present

## 2019-10-07 DIAGNOSIS — M5126 Other intervertebral disc displacement, lumbar region: Secondary | ICD-10-CM | POA: Diagnosis not present

## 2019-10-15 DIAGNOSIS — M5416 Radiculopathy, lumbar region: Secondary | ICD-10-CM | POA: Diagnosis not present

## 2019-10-15 DIAGNOSIS — M5126 Other intervertebral disc displacement, lumbar region: Secondary | ICD-10-CM | POA: Diagnosis not present

## 2019-11-14 ENCOUNTER — Encounter: Payer: Self-pay | Admitting: Sports Medicine

## 2019-11-14 ENCOUNTER — Telehealth: Payer: Self-pay | Admitting: Sports Medicine

## 2019-11-14 ENCOUNTER — Ambulatory Visit (INDEPENDENT_AMBULATORY_CARE_PROVIDER_SITE_OTHER): Payer: BLUE CROSS/BLUE SHIELD | Admitting: Sports Medicine

## 2019-11-14 DIAGNOSIS — K219 Gastro-esophageal reflux disease without esophagitis: Secondary | ICD-10-CM

## 2019-11-14 DIAGNOSIS — R05 Cough: Secondary | ICD-10-CM

## 2019-11-14 DIAGNOSIS — R053 Chronic cough: Secondary | ICD-10-CM | POA: Insufficient documentation

## 2019-11-14 MED ORDER — ALBUTEROL SULFATE HFA 108 (90 BASE) MCG/ACT IN AERS: 2.0000 | INHALATION_SPRAY | Freq: Four times a day (QID) | RESPIRATORY_TRACT | 11 refills | Status: DC | PRN

## 2019-11-14 MED ORDER — BUDESONIDE-FORMOTEROL FUMARATE 160-4.5 MCG/ACT IN AERO: 1.0000 | INHALATION_SPRAY | Freq: Two times a day (BID) | RESPIRATORY_TRACT | 3 refills | Status: AC

## 2019-11-14 NOTE — Assessment & Plan Note (Signed)
Chronic cough when the air gets cold and dry, no improvement with omeprazole, possible chronic sinusitis but no intervention performed. Frankly this sounds like a reactive airway type syndrome such as asthma. No constitutional symptoms, adding Symbicort and albuterol for rescue. Return to see me in a month, at some point we may consider pre and postbronchodilator spirometry, as well as serum testing for other disease that could cause chronic cough, however I think this is going to solve his problem. Return to see me in 1 month in person.

## 2019-11-14 NOTE — Assessment & Plan Note (Signed)
Did not help cough, ultimately we may have to discontinue his omeprazole, further discussion at the in person visit in a month.

## 2019-11-14 NOTE — Progress Notes (Signed)
Virtual Visit via WebEx/MyChart   I connected with  Albert Mills  on 11/14/19 via WebEx/MyChart/Doximity Video and verified that I am speaking with the correct person using two identifiers.   I discussed the limitations, risks, security and privacy concerns of performing an evaluation and management service by WebEx/MyChart/Doximity Video, including the higher likelihood of inaccurate diagnosis and treatment, and the availability of in person appointments.  We also discussed the likely need of an additional face to face encounter for complete and high quality delivery of care.  I also discussed with the patient that there may be a patient responsible charge related to this service. The patient expressed understanding and wishes to proceed.  Provider location is either at home or medical facility. Patient location is at their home, different from provider location. People involved in care of the patient during this telehealth encounter were myself, my nurse/medical assistant, and my front office/scheduling team member.  Subjective:    CC: Coughing  HPI: Albert Mills has a chronic cough, see below for further details.  I reviewed the past medical history, family history, social history, surgical history, and allergies today and no changes were needed.  Please see the problem list section below in epic for further details.  Past Medical History: Past Medical History:  Diagnosis Date  . HTN (hypertension)   . Hyperlipemia    Past Surgical History: No past surgical history on file. Social History: Social History   Socioeconomic History  . Marital status: Married    Spouse name: Not on file  . Number of children: Not on file  . Years of education: Not on file  . Highest education level: Not on file  Occupational History  . Not on file  Tobacco Use  . Smoking status: Never Smoker  . Smokeless tobacco: Never Used  Substance and Sexual Activity  . Alcohol use: Yes  . Drug use: No  .  Sexual activity: Yes    Partners: Female  Other Topics Concern  . Not on file  Social History Narrative  . Not on file   Social Determinants of Health   Financial Resource Strain:   . Difficulty of Paying Living Expenses: Not on file  Food Insecurity:   . Worried About Charity fundraiser in the Last Year: Not on file  . Ran Out of Food in the Last Year: Not on file  Transportation Needs:   . Lack of Transportation (Medical): Not on file  . Lack of Transportation (Non-Medical): Not on file  Physical Activity:   . Days of Exercise per Week: Not on file  . Minutes of Exercise per Session: Not on file  Stress:   . Feeling of Stress : Not on file  Social Connections:   . Frequency of Communication with Friends and Family: Not on file  . Frequency of Social Gatherings with Friends and Family: Not on file  . Attends Religious Services: Not on file  . Active Member of Clubs or Organizations: Not on file  . Attends Archivist Meetings: Not on file  . Marital Status: Not on file   Family History: Family History  Problem Relation Age of Onset  . Heart disease Paternal Uncle   . Heart disease Paternal Grandfather   . Lupus Mother   . Lupus Sister    Allergies: Allergies  Allergen Reactions  . Lisinopril Other (See Comments)    Cough   Medications: See med rec.  Review of Systems: No fevers, chills, night sweats,  weight loss, chest pain, or shortness of breath.   Objective:    General: Speaking full sentences, no audible heavy breathing.  Sounds alert and appropriately interactive.  Appears well.  Face symmetric.  Extraocular movements intact.  Pupils equal and round.  No nasal flaring or accessory muscle use visualized.  No other physical exam performed due to the non-physical nature of this visit.  Impression and Recommendations:    Chronic cough Chronic cough when the air gets cold and dry, no improvement with omeprazole, possible chronic sinusitis but no  intervention performed. Frankly this sounds like a reactive airway type syndrome such as asthma. No constitutional symptoms, adding Symbicort and albuterol for rescue. Return to see me in a month, at some point we may consider pre and postbronchodilator spirometry, as well as serum testing for other disease that could cause chronic cough, however I think this is going to solve his problem. Return to see me in 1 month in person.  Acid reflux Did not help cough, ultimately we may have to discontinue his omeprazole, further discussion at the in person visit in a month.   I discussed the above assessment and treatment plan with the patient. The patient was provided an opportunity to ask questions and all were answered. The patient agreed with the plan and demonstrated an understanding of the instructions.   The patient was advised to call back or seek an in-person evaluation if the symptoms worsen or if the condition fails to improve as anticipated.   I provided 25 minutes of non-face-to-face time during this encounter, 15 minutes of additional time was needed to gather information, review chart, records, communicate/coordinate with staff remotely, troubleshooting the multiple errors that we get every time when trying to do video calls through the electronic medical record, WebEx, and Doximity, restart the encounter multiple times due to instability of the software, as well as complete documentation.   ___________________________________________ Ihor Austin. Benjamin Stain, M.D., ABFM., CAQSM. Primary Care and Sports Medicine [facility]  Adjunct Professor of Family Medicine  [employer]

## 2019-11-14 NOTE — Telephone Encounter (Signed)
Patient called and is in need of something for congestion. Per Dr. Darene Lamer ok to work in for a virtual for today at 1:15 pm. No other questions.

## 2020-03-31 ENCOUNTER — Telehealth: Payer: Self-pay | Admitting: Family Medicine

## 2020-03-31 ENCOUNTER — Other Ambulatory Visit: Payer: Self-pay

## 2020-03-31 MED ORDER — AMLODIPINE BESYLATE 10 MG PO TABS: ORAL_TABLET | ORAL | 0 refills | Status: DC

## 2020-03-31 MED ORDER — OMEPRAZOLE 40 MG PO CPDR: 40.0000 mg | DELAYED_RELEASE_CAPSULE | Freq: Every day | ORAL | 0 refills | Status: DC

## 2020-03-31 MED ORDER — TADALAFIL 20 MG PO TABS: ORAL_TABLET | ORAL | 0 refills | Status: DC

## 2020-03-31 MED ORDER — ATORVASTATIN CALCIUM 40 MG PO TABS: ORAL_TABLET | ORAL | 0 refills | Status: DC

## 2020-03-31 NOTE — Telephone Encounter (Signed)
Refilled the 4 most recent Rx's for 30 days.

## 2020-03-31 NOTE — Telephone Encounter (Signed)
Albert Mills called this afternoon to schedule his physical and transfer of care from Dr.Corey. He is scheduled for 05/05. He also would like to switch his pharmacy on file to [facility] here in [place]. He asked if refills could be called in,stated that he is out of all four.

## 2020-04-07 ENCOUNTER — Ambulatory Visit (INDEPENDENT_AMBULATORY_CARE_PROVIDER_SITE_OTHER): Payer: BLUE CROSS/BLUE SHIELD | Admitting: Family Medicine

## 2020-04-07 ENCOUNTER — Encounter: Payer: Self-pay | Admitting: Family Medicine

## 2020-04-07 ENCOUNTER — Other Ambulatory Visit: Payer: Self-pay

## 2020-04-07 VITALS — BP 138/88 | HR 81 | Ht 72.84 in | Wt 339.8 lb

## 2020-04-07 DIAGNOSIS — I10 Essential (primary) hypertension: Secondary | ICD-10-CM

## 2020-04-07 DIAGNOSIS — Z125 Encounter for screening for malignant neoplasm of prostate: Secondary | ICD-10-CM | POA: Diagnosis not present

## 2020-04-07 DIAGNOSIS — Z0000 Encounter for general adult medical examination without abnormal findings: Secondary | ICD-10-CM

## 2020-04-07 DIAGNOSIS — E782 Mixed hyperlipidemia: Secondary | ICD-10-CM | POA: Diagnosis not present

## 2020-04-07 MED ORDER — AMLODIPINE BESYLATE 10 MG PO TABS: ORAL_TABLET | ORAL | 2 refills | Status: DC

## 2020-04-07 MED ORDER — ATORVASTATIN CALCIUM 40 MG PO TABS: ORAL_TABLET | ORAL | 2 refills | Status: DC

## 2020-04-07 MED ORDER — TADALAFIL 20 MG PO TABS: ORAL_TABLET | ORAL | 6 refills | Status: DC

## 2020-04-07 MED ORDER — OMEPRAZOLE 40 MG PO CPDR: 40.0000 mg | DELAYED_RELEASE_CAPSULE | Freq: Every day | ORAL | 2 refills | Status: AC

## 2020-04-07 NOTE — Progress Notes (Signed)
Albert Mills - 59 y.o. male MRN [id number]  Date of birth: 1961/04/17  Subjective Chief Complaint  Patient presents with  . Annual Exam    HPI Albert Mills is a 59 y.o. male wit history of HTN and HLD here today for annual exam.  He denies any significant changes to his history since last visit.  He reports he is doing well.  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    He is a non-smoker.  He consumes EtOH in moderation a few days per week.   He feels like he follows a healthy diet.  He walks and plays golf for exercise.    He has had COVID vaccine.   Review of Systems  Constitutional: Negative for chills, fever, malaise/fatigue and weight loss.  HENT: Negative for congestion, ear pain and sore throat.   Eyes: Negative for blurred vision, double vision and pain.  Respiratory: Negative for cough and shortness of breath.   Cardiovascular: Negative for chest pain and palpitations.  Gastrointestinal: Negative for abdominal pain, blood in stool, constipation, heartburn and nausea.  Genitourinary: Negative for dysuria and urgency.  Musculoskeletal: Negative for joint pain and myalgias.  Neurological: Negative for dizziness and headaches.  Endo/Heme/Allergies: Does not bruise/bleed easily.  Psychiatric/Behavioral: Negative for depression. The patient is not nervous/anxious and does not have insomnia.     Allergies  Allergen Reactions  . Lisinopril Other (See Comments)    Cough    Past Medical History:  Diagnosis Date  . HTN (hypertension)   . Hyperlipemia     History reviewed. No pertinent surgical history.  Social History   Socioeconomic History  . Marital status: Married    Spouse name: Not on file  . Number of children: Not on file  . Years of education: Not on file  . Highest education level: Not on file  Occupational History  . Not on file  Tobacco Use  . Smoking status: Never Smoker  . Smokeless tobacco: Never Used  Substance and Sexual  Activity  . Alcohol use: Yes  . Drug use: No  . Sexual activity: Yes    Partners: Female  Other Topics Concern  . Not on file  Social History Narrative  . Not on file   Social Determinants of Health   Financial Resource Strain:   . Difficulty of Paying Living Expenses:   Food Insecurity:   . Worried About Charity fundraiser in the Last Year:   . Arboriculturist in the Last Year:   Transportation Needs:   . Film/video editor (Medical):   Marland Kitchen Lack of Transportation (Non-Medical):   Physical Activity:   . Days of Exercise per Week:   . Minutes of Exercise per Session:   Stress:   . Feeling of Stress :   Social Connections:   . Frequency of Communication with Friends and Family:   . Frequency of Social Gatherings with Friends and Family:   . Attends Religious Services:   . Active Member of Clubs or Organizations:   . Attends Archivist Meetings:   Marland Kitchen Marital Status:     Family History  Problem Relation Age of Onset  . Heart disease Paternal Uncle   . Heart disease Paternal Grandfather   . Lupus Mother   . Lupus Sister     Health Maintenance  Topic Date Due  . HIV Screening  06/26/2029 (Originally 04/[birth year]/1976)  . INFLUENZA VACCINE  07/04/2020  . COLONOSCOPY  12/05/2026  .  TETANUS/TDAP  08/25/2029  . COVID-19 Vaccine  Completed  . Hepatitis C Screening  Completed     ----------------------------------------------------------------------------------------------------------------------------------------------------------------------------------------------------------------- Physical Exam BP 138/88 (BP Location: Left Arm, Patient Position: Sitting, Cuff Size: Large)   Pulse 81   Ht 6' 0.84" (1.85 m)   Wt (!) 339 lb 12.8 oz (154.1 kg)   SpO2 97%   BMI 45.03 kg/m   Physical Exam Constitutional:      General: He is not in acute distress. HENT:     Head: Normocephalic and atraumatic.     Right Ear: Tympanic membrane and external ear normal.     Left  Ear: Tympanic membrane and external ear normal.     Mouth/Throat:     Mouth: Mucous membranes are moist.  Eyes:     General: No scleral icterus. Neck:     Thyroid: No thyromegaly.  Cardiovascular:     Rate and Rhythm: Normal rate and regular rhythm.     Heart sounds: Normal heart sounds.  Pulmonary:     Effort: Pulmonary effort is normal.     Breath sounds: Normal breath sounds.  Abdominal:     General: Bowel sounds are normal. There is no distension.     Palpations: Abdomen is soft.     Tenderness: There is no abdominal tenderness. There is no guarding.  Musculoskeletal:     Cervical back: Normal range of motion.  Lymphadenopathy:     Cervical: No cervical adenopathy.  Skin:    General: Skin is warm and dry.     Findings: No rash.  Neurological:     General: No focal deficit present.     Mental Status: He is alert and oriented to person, place, and time.     Cranial Nerves: No cranial nerve deficit.     Motor: No abnormal muscle tone.  Psychiatric:        Behavior: Behavior normal.     ------------------------------------------------------------------------------------------------------------------------------------------------------------------------------------------------------------------- Assessment and Plan  Well adult exam Well adult Orders Placed This Encounter  Procedures  . COMPLETE METABOLIC PANEL WITH GFR  . CBC  . Lipid Profile  . TSH  . PSA  Screening: PSA Immunizations: UTD Anticipatory guidance/Risk factor reduction:  Continue current medications to reduce risk of heart disease and stroke. Follow healthy diet with regular exercise.  Additional recommendations per AVS.    Meds ordered this encounter  Medications  . amLODipine (NORVASC) 10 MG tablet    Sig: TAKE 1 TABLET(10 MG) BY MOUTH DAILY    Dispense:  90 tablet    Refill:  2  . atorvastatin (LIPITOR) 40 MG tablet    Sig: TAKE 1 TABLET(40 MG) BY MOUTH DAILY    Dispense:  90 tablet     Refill:  2  . tadalafil (CIALIS) 20 MG tablet    Sig: Use 1 daily po as needed for ED    Dispense:  30 tablet    Refill:  6  . omeprazole (PRILOSEC) 40 MG capsule    Sig: Take 1 capsule (40 mg total) by mouth daily.    Dispense:  90 capsule    Refill:  2    Return in about 6 months (around 10/08/2020) for HTN.    This visit occurred during the SARS-CoV-2 public health emergency.  Safety protocols were in place, including screening questions prior to the visit, additional usage of staff PPE, and extensive cleaning of exam room while observing appropriate contact time as indicated for disinfecting solutions.

## 2020-04-07 NOTE — Assessment & Plan Note (Signed)
Well adult Orders Placed This Encounter  Procedures  . COMPLETE METABOLIC PANEL WITH GFR  . CBC  . Lipid Profile  . TSH  . PSA  Screening: PSA Immunizations: UTD Anticipatory guidance/Risk factor reduction:  Continue current medications to reduce risk of heart disease and stroke. Follow healthy diet with regular exercise.  Additional recommendations per AVS.

## 2020-04-07 NOTE — Patient Instructions (Addendum)
Great to meet you today! Please continue current medications.  Have labs completed.  We'll be in touch with results.  See me again in 6 months.     Preventive Care 59-59 Years Old, Male Preventive care refers to lifestyle choices and visits with your health care provider that can promote health and wellness. This includes:  A yearly physical exam. This is also called an annual well check.  Regular dental and eye exams.  Immunizations.  Screening for certain conditions.  Healthy lifestyle choices, such as eating a healthy diet, getting regular exercise, not using drugs or products that contain nicotine and tobacco, and limiting alcohol use. What can I expect for my preventive care visit? Physical exam Your health care provider will check:  Height and weight. These may be used to calculate body mass index (BMI), which is a measurement that tells if you are at a healthy weight.  Heart rate and blood pressure.  Your skin for abnormal spots. Counseling Your health care provider may ask you questions about:  Alcohol, tobacco, and drug use.  Emotional well-being.  Home and relationship well-being.  Sexual activity.  Eating habits.  Work and work Statistician. What immunizations do I need?  Influenza (flu) vaccine  This is recommended every year. Tetanus, diphtheria, and pertussis (Tdap) vaccine  You may need a Td booster every 10 years. Varicella (chickenpox) vaccine  You may need this vaccine if you have not already been vaccinated. Zoster (shingles) vaccine  You may need this after age 64. Measles, mumps, and rubella (MMR) vaccine  You may need at least one dose of MMR if you were born in 1957 or later. You may also need a second dose. Pneumococcal conjugate (PCV13) vaccine  You may need this if you have certain conditions and were not previously vaccinated. Pneumococcal polysaccharide (PPSV23) vaccine  You may need one or two doses if you smoke cigarettes or  if you have certain conditions. Meningococcal conjugate (MenACWY) vaccine  You may need this if you have certain conditions. Hepatitis A vaccine  You may need this if you have certain conditions or if you travel or work in places where you may be exposed to hepatitis A. Hepatitis B vaccine  You may need this if you have certain conditions or if you travel or work in places where you may be exposed to hepatitis B. Haemophilus influenzae type b (Hib) vaccine  You may need this if you have certain risk factors. Human papillomavirus (HPV) vaccine  If recommended by your health care provider, you may need three doses over 6 months. You may receive vaccines as individual doses or as more than one vaccine together in one shot (combination vaccines). Talk with your health care provider about the risks and benefits of combination vaccines. What tests do I need? Blood tests  Lipid and cholesterol levels. These may be checked every 5 years, or more frequently if you are over 59 years old.  Hepatitis C test.  Hepatitis B test. Screening  Lung cancer screening. You may have this screening every year starting at age 70 if you have a 30-pack-year history of smoking and currently smoke or have quit within the past 15 years.  Prostate cancer screening. Recommendations will vary depending on your family history and other risks.  Colorectal cancer screening. All adults should have this screening starting at age 101 and continuing until age 85. Your health care provider may recommend screening at age 25 if you are at increased risk. You will have  tests every 1-10 years, depending on your results and the type of screening test.  Diabetes screening. This is done by checking your blood sugar (glucose) after you have not eaten for a while (fasting). You may have this done every 1-3 years.  Sexually transmitted disease (STD) testing. Follow these instructions at home: Eating and drinking  Eat a diet  that includes fresh fruits and vegetables, whole grains, lean protein, and low-fat dairy products.  Take vitamin and mineral supplements as recommended by your health care provider.  Do not drink alcohol if your health care provider tells you not to drink.  If you drink alcohol: ? Limit how much you have to 0-2 drinks a day. ? Be aware of how much alcohol is in your drink. In the [place] one drink equals one 12 oz bottle of beer (355 mL), one 5 oz glass of wine (148 mL), or one 1 oz glass of hard liquor (44 mL). Lifestyle  Take daily care of your teeth and gums.  Stay active. Exercise for at least 30 minutes on 5 or more days each week.  Do not use any products that contain nicotine or tobacco, such as cigarettes, e-cigarettes, and chewing tobacco. If you need help quitting, ask your health care provider.  If you are sexually active, practice safe sex. Use a condom or other form of protection to prevent STIs (sexually transmitted infections).  Talk with your health care provider about taking a low-dose aspirin every day starting at age 81. What's next?  Go to your health care provider once a year for a well check visit.  Ask your health care provider how often you should have your eyes and teeth checked.  Stay up to date on all vaccines. This information is not intended to replace advice given to you by your health care provider. Make sure you discuss any questions you have with your health care provider. Document Revised: 11/14/2018 Document Reviewed: 11/14/2018 Elsevier Patient Education  2020 [employer].

## 2020-04-08 LAB — COMPLETE METABOLIC PANEL WITH GFR
AG Ratio: 1.7 (calc) (ref 1.0–2.5)
ALT: 24 U/L (ref 9–46)
AST: 20 U/L (ref 10–35)
Albumin: 4.1 g/dL (ref 3.6–5.1)
Alkaline phosphatase (APISO): 86 U/L (ref 35–144)
BUN: 15 mg/dL (ref 7–25)
CO2: 24 mmol/L (ref 20–32)
Calcium: 9.3 mg/dL (ref 8.6–10.3)
Chloride: 107 mmol/L (ref 98–110)
Creat: 0.82 mg/dL (ref 0.70–1.33)
GFR, Est African American: 112 mL/min/{1.73_m2} (ref >=60)
GFR, Est Non African American: 97 mL/min/{1.73_m2} (ref >=60)
Globulin: 2.4 g/dL (calc) (ref 1.9–3.7)
Glucose, Bld: 97 mg/dL (ref 65–99)
Potassium: 3.8 mmol/L (ref 3.5–5.3)
Sodium: 137 mmol/L (ref 135–146)
Total Bilirubin: 0.7 mg/dL (ref 0.2–1.2)
Total Protein: 6.5 g/dL (ref 6.1–8.1)

## 2020-04-08 LAB — PSA: PSA: 0.4 ng/mL (ref <=4.0)

## 2020-04-08 LAB — TSH: TSH: 0.64 mIU/L (ref 0.40–4.50)

## 2020-04-08 LAB — LIPID PANEL
Cholesterol: 209 mg/dL — ABNORMAL HIGH (ref <200)
HDL: 63 mg/dL (ref >=40)
LDL Cholesterol (Calc): 126 mg/dL (calc) — ABNORMAL HIGH
Non-HDL Cholesterol (Calc): 146 mg/dL (calc) — ABNORMAL HIGH (ref <130)
Total CHOL/HDL Ratio: 3.3 (calc) (ref <5.0)
Triglycerides: 95 mg/dL (ref <150)

## 2020-04-08 LAB — CBC
HCT: 48.1 % (ref 38.5–50.0)
Hemoglobin: 16.2 g/dL (ref 13.2–17.1)
MCH: 30.9 pg (ref 27.0–33.0)
MCHC: 33.7 g/dL (ref 32.0–36.0)
MCV: 91.6 fL (ref 80.0–100.0)
MPV: 9.8 fL (ref 7.5–12.5)
Platelets: 175 10*3/uL (ref 140–400)
RBC: 5.25 10*6/uL (ref 4.20–5.80)
RDW: 13.3 % (ref 11.0–15.0)
WBC: 4.8 10*3/uL (ref 3.8–10.8)

## 2020-10-08 ENCOUNTER — Ambulatory Visit: Payer: BLUE CROSS/BLUE SHIELD | Admitting: Family Medicine

## 2020-11-29 ENCOUNTER — Other Ambulatory Visit: Payer: Self-pay | Admitting: Sports Medicine

## 2020-11-29 DIAGNOSIS — R053 Chronic cough: Secondary | ICD-10-CM

## 2021-03-10 ENCOUNTER — Other Ambulatory Visit: Payer: Self-pay | Admitting: Family Medicine

## 2021-07-06 ENCOUNTER — Other Ambulatory Visit: Payer: Self-pay | Admitting: Family Medicine

## 2021-07-12 ENCOUNTER — Other Ambulatory Visit: Payer: Self-pay | Admitting: Family Medicine

## 2021-12-05 ENCOUNTER — Telehealth: Payer: Self-pay | Admitting: Family Medicine

## 2021-12-06 NOTE — Telephone Encounter (Signed)
Please contact pt to schedule follow-up appt. Overdue since November.   Unable to provide refills until appt scheduled.   Thanks

## 2021-12-08 NOTE — Telephone Encounter (Signed)
I took out Dr Ashley Royalty as PCP since patient stated he is no longer a patient here.

## 2021-12-08 NOTE — Telephone Encounter (Signed)
Spoke with Pt and he stated he does not come to this practice anymore. He see Dr. Wilmer Floor now and will reach out to that office and his insurance before scheduling an appt.

## 2022-08-18 ENCOUNTER — Other Ambulatory Visit: Payer: Self-pay | Admitting: Family Medicine

## 2022-08-21 ENCOUNTER — Other Ambulatory Visit: Payer: Self-pay | Admitting: Family Medicine

## 2022-08-25 ENCOUNTER — Other Ambulatory Visit: Payer: Self-pay | Admitting: Family Medicine
